# Patient Record
Sex: Male | Born: 1955
Health system: Southern US, Community
[De-identification: ages and names within clinical notes are randomized; demographics above are authoritative.]

## PROBLEM LIST (undated history)

## (undated) DIAGNOSIS — I10 Essential (primary) hypertension: Secondary | ICD-10-CM

## (undated) DIAGNOSIS — I2510 Atherosclerotic heart disease of native coronary artery without angina pectoris: Secondary | ICD-10-CM

## (undated) DIAGNOSIS — E7800 Pure hypercholesterolemia, unspecified: Secondary | ICD-10-CM

## (undated) DIAGNOSIS — E119 Type 2 diabetes mellitus without complications: Secondary | ICD-10-CM

## (undated) HISTORY — DX: Essential (primary) hypertension: I10

## (undated) HISTORY — DX: Pure hypercholesterolemia, unspecified: E78.00

## (undated) HISTORY — DX: Atherosclerotic heart disease of native coronary artery without angina pectoris: I25.10

---

## 1999-03-21 ENCOUNTER — Ambulatory Visit (HOSPITAL_COMMUNITY): Admission: RE | Admit: 1999-03-21 | Discharge: 1999-03-21 | Payer: Self-pay | Admitting: Gastroenterology

## 1999-09-28 ENCOUNTER — Emergency Department (HOSPITAL_COMMUNITY): Admission: EM | Admit: 1999-09-28 | Discharge: 1999-09-28 | Payer: Self-pay | Admitting: Emergency Medicine

## 2002-11-28 ENCOUNTER — Encounter: Payer: Self-pay | Admitting: Emergency Medicine

## 2002-11-28 ENCOUNTER — Emergency Department (HOSPITAL_COMMUNITY): Admission: EM | Admit: 2002-11-28 | Discharge: 2002-11-28 | Payer: Self-pay | Admitting: Emergency Medicine

## 2008-05-06 ENCOUNTER — Encounter: Admission: RE | Admit: 2008-05-06 | Discharge: 2008-05-06 | Payer: Self-pay | Admitting: Internal Medicine

## 2008-12-16 ENCOUNTER — Inpatient Hospital Stay (HOSPITAL_COMMUNITY): Admission: EM | Admit: 2008-12-16 | Discharge: 2008-12-21 | Payer: Self-pay | Admitting: Emergency Medicine

## 2010-11-28 LAB — GLUCOSE, CAPILLARY
Glucose-Capillary: 122 mg/dL — ABNORMAL HIGH (ref 70–99)
Glucose-Capillary: 145 mg/dL — ABNORMAL HIGH (ref 70–99)
Glucose-Capillary: 147 mg/dL — ABNORMAL HIGH (ref 70–99)
Glucose-Capillary: 150 mg/dL — ABNORMAL HIGH (ref 70–99)
Glucose-Capillary: 153 mg/dL — ABNORMAL HIGH (ref 70–99)
Glucose-Capillary: 169 mg/dL — ABNORMAL HIGH (ref 70–99)
Glucose-Capillary: 169 mg/dL — ABNORMAL HIGH (ref 70–99)
Glucose-Capillary: 176 mg/dL — ABNORMAL HIGH (ref 70–99)
Glucose-Capillary: 182 mg/dL — ABNORMAL HIGH (ref 70–99)
Glucose-Capillary: 187 mg/dL — ABNORMAL HIGH (ref 70–99)
Glucose-Capillary: 188 mg/dL — ABNORMAL HIGH (ref 70–99)
Glucose-Capillary: 208 mg/dL — ABNORMAL HIGH (ref 70–99)
Glucose-Capillary: 232 mg/dL — ABNORMAL HIGH (ref 70–99)

## 2010-11-28 LAB — BASIC METABOLIC PANEL
BUN: 7 mg/dL (ref 6–23)
BUN: 7 mg/dL (ref 6–23)
BUN: 8 mg/dL (ref 6–23)
BUN: 8 mg/dL (ref 6–23)
CO2: 26 mEq/L (ref 19–32)
CO2: 27 mEq/L (ref 19–32)
CO2: 29 mEq/L (ref 19–32)
CO2: 29 mEq/L (ref 19–32)
Calcium: 8.3 mg/dL — ABNORMAL LOW (ref 8.4–10.5)
Calcium: 8.3 mg/dL — ABNORMAL LOW (ref 8.4–10.5)
Calcium: 8.3 mg/dL — ABNORMAL LOW (ref 8.4–10.5)
Calcium: 8.5 mg/dL (ref 8.4–10.5)
Chloride: 103 mEq/L (ref 96–112)
Chloride: 104 mEq/L (ref 96–112)
Chloride: 105 mEq/L (ref 96–112)
Chloride: 106 mEq/L (ref 96–112)
Creatinine, Ser: 0.83 mg/dL (ref 0.4–1.5)
Creatinine, Ser: 0.89 mg/dL (ref 0.4–1.5)
Creatinine, Ser: 0.95 mg/dL (ref 0.4–1.5)
Creatinine, Ser: 0.98 mg/dL (ref 0.4–1.5)
GFR calc Af Amer: >60 mL/min (ref >60)
GFR calc Af Amer: >60 mL/min (ref >60)
GFR calc Af Amer: >60 mL/min (ref >60)
GFR calc Af Amer: >60 mL/min (ref >60)
GFR calc non Af Amer: >60 mL/min (ref >60)
GFR calc non Af Amer: >60 mL/min (ref >60)
GFR calc non Af Amer: >60 mL/min (ref >60)
GFR calc non Af Amer: >60 mL/min (ref >60)
Glucose, Bld: 152 mg/dL — ABNORMAL HIGH (ref 70–99)
Glucose, Bld: 154 mg/dL — ABNORMAL HIGH (ref 70–99)
Glucose, Bld: 166 mg/dL — ABNORMAL HIGH (ref 70–99)
Glucose, Bld: 204 mg/dL — ABNORMAL HIGH (ref 70–99)
Potassium: 3.6 mEq/L (ref 3.5–5.1)
Potassium: 3.7 mEq/L (ref 3.5–5.1)
Potassium: 3.8 mEq/L (ref 3.5–5.1)
Potassium: 3.8 mEq/L (ref 3.5–5.1)
Sodium: 136 mEq/L (ref 135–145)
Sodium: 137 mEq/L (ref 135–145)
Sodium: 138 mEq/L (ref 135–145)
Sodium: 139 mEq/L (ref 135–145)

## 2010-11-28 LAB — CBC
HCT: 35 % — ABNORMAL LOW (ref 39.0–52.0)
HCT: 35.1 % — ABNORMAL LOW (ref 39.0–52.0)
HCT: 35.8 % — ABNORMAL LOW (ref 39.0–52.0)
Hemoglobin: 11.9 g/dL — ABNORMAL LOW (ref 13.0–17.0)
Hemoglobin: 11.9 g/dL — ABNORMAL LOW (ref 13.0–17.0)
Hemoglobin: 11.9 g/dL — ABNORMAL LOW (ref 13.0–17.0)
MCHC: 33.4 g/dL (ref 30.0–36.0)
MCHC: 34 g/dL (ref 30.0–36.0)
MCHC: 34 g/dL (ref 30.0–36.0)
MCV: 82.9 fL (ref 78.0–100.0)
MCV: 83.1 fL (ref 78.0–100.0)
MCV: 83.3 fL (ref 78.0–100.0)
Platelets: 243 10*3/uL (ref 150–400)
Platelets: 250 10*3/uL (ref 150–400)
Platelets: 272 10*3/uL (ref 150–400)
RBC: 4.22 MIL/uL (ref 4.22–5.81)
RBC: 4.22 MIL/uL (ref 4.22–5.81)
RBC: 4.29 MIL/uL (ref 4.22–5.81)
RDW: 14.2 % (ref 11.5–15.5)
RDW: 14.3 % (ref 11.5–15.5)
RDW: 14.5 % (ref 11.5–15.5)
WBC: 4.4 10*3/uL (ref 4.0–10.5)
WBC: 4.7 10*3/uL (ref 4.0–10.5)
WBC: 5.1 10*3/uL (ref 4.0–10.5)

## 2010-11-28 LAB — VANCOMYCIN, TROUGH: Vancomycin Tr: 15.3 ug/mL (ref 10.0–20.0)

## 2010-11-29 LAB — GLUCOSE, CAPILLARY
Glucose-Capillary: 140 mg/dL — ABNORMAL HIGH (ref 70–99)
Glucose-Capillary: 176 mg/dL — ABNORMAL HIGH (ref 70–99)
Glucose-Capillary: 186 mg/dL — ABNORMAL HIGH (ref 70–99)
Glucose-Capillary: 191 mg/dL — ABNORMAL HIGH (ref 70–99)
Glucose-Capillary: 208 mg/dL — ABNORMAL HIGH (ref 70–99)
Glucose-Capillary: 220 mg/dL — ABNORMAL HIGH (ref 70–99)
Glucose-Capillary: 237 mg/dL — ABNORMAL HIGH (ref 70–99)
Glucose-Capillary: 241 mg/dL — ABNORMAL HIGH (ref 70–99)
Glucose-Capillary: 261 mg/dL — ABNORMAL HIGH (ref 70–99)

## 2010-11-29 LAB — COMPREHENSIVE METABOLIC PANEL
ALT: 23 U/L (ref 0–53)
AST: 20 U/L (ref 0–37)
Albumin: 3 g/dL — ABNORMAL LOW (ref 3.5–5.2)
Alkaline Phosphatase: 60 U/L (ref 39–117)
BUN: 11 mg/dL (ref 6–23)
CO2: 26 mEq/L (ref 19–32)
Calcium: 8.5 mg/dL (ref 8.4–10.5)
Chloride: 101 mEq/L (ref 96–112)
Creatinine, Ser: 0.92 mg/dL (ref 0.4–1.5)
GFR calc Af Amer: >60 mL/min (ref >60)
GFR calc non Af Amer: >60 mL/min (ref >60)
Glucose, Bld: 214 mg/dL — ABNORMAL HIGH (ref 70–99)
Potassium: 4 mEq/L (ref 3.5–5.1)
Sodium: 134 mEq/L — ABNORMAL LOW (ref 135–145)
Total Bilirubin: 0.5 mg/dL (ref 0.3–1.2)
Total Protein: 7 g/dL (ref 6.0–8.3)

## 2010-11-29 LAB — POCT I-STAT, CHEM 8
BUN: 14 mg/dL (ref 6–23)
Calcium, Ion: 1.12 mmol/L (ref 1.12–1.32)
Chloride: 101 mEq/L (ref 96–112)
Creatinine, Ser: 1 mg/dL (ref 0.4–1.5)
Glucose, Bld: 258 mg/dL — ABNORMAL HIGH (ref 70–99)
HCT: 39 % (ref 39.0–52.0)
Hemoglobin: 13.3 g/dL (ref 13.0–17.0)
Potassium: 3.7 mEq/L (ref 3.5–5.1)
Sodium: 136 mEq/L (ref 135–145)
TCO2: 25 mmol/L (ref 0–100)

## 2010-11-29 LAB — CBC
HCT: 36.7 % — ABNORMAL LOW (ref 39.0–52.0)
HCT: 36.8 % — ABNORMAL LOW (ref 39.0–52.0)
Hemoglobin: 12.3 g/dL — ABNORMAL LOW (ref 13.0–17.0)
Hemoglobin: 12.4 g/dL — ABNORMAL LOW (ref 13.0–17.0)
MCHC: 33.5 g/dL (ref 30.0–36.0)
MCHC: 33.9 g/dL (ref 30.0–36.0)
MCV: 82.1 fL (ref 78.0–100.0)
MCV: 82.9 fL (ref 78.0–100.0)
Platelets: 225 10*3/uL (ref 150–400)
Platelets: 243 10*3/uL (ref 150–400)
RBC: 4.44 MIL/uL (ref 4.22–5.81)
RBC: 4.47 MIL/uL (ref 4.22–5.81)
RDW: 13.9 % (ref 11.5–15.5)
RDW: 14.6 % (ref 11.5–15.5)
WBC: 3.5 10*3/uL — ABNORMAL LOW (ref 4.0–10.5)
WBC: 4 10*3/uL (ref 4.0–10.5)

## 2010-11-29 LAB — HEMOGLOBIN A1C
Hgb A1c MFr Bld: 10.3 % — ABNORMAL HIGH (ref 4.6–6.1)
Mean Plasma Glucose: 249 mg/dL

## 2010-11-29 LAB — DIFFERENTIAL
Basophils Absolute: 0 10*3/uL (ref 0.0–0.1)
Basophils Relative: 0 % (ref 0–1)
Eosinophils Absolute: 0.1 10*3/uL (ref 0.0–0.7)
Eosinophils Relative: 2 % (ref 0–5)
Lymphocytes Relative: 26 % (ref 12–46)
Lymphs Abs: 0.9 10*3/uL (ref 0.7–4.0)
Monocytes Absolute: 0.5 10*3/uL (ref 0.1–1.0)
Monocytes Relative: 14 % — ABNORMAL HIGH (ref 3–12)
Neutro Abs: 2 10*3/uL (ref 1.7–7.7)
Neutrophils Relative %: 58 % (ref 43–77)

## 2010-11-29 LAB — CULTURE, BLOOD (ROUTINE X 2)
Culture: NO GROWTH
Culture: NO GROWTH

## 2010-11-29 LAB — MAGNESIUM: Magnesium: 1.9 mg/dL (ref 1.5–2.5)

## 2010-11-29 LAB — D-DIMER, QUANTITATIVE: D-Dimer, Quant: 0.41 ug/mL-FEU (ref 0.00–0.48)

## 2010-11-29 LAB — APTT: aPTT: 37 seconds (ref 24–37)

## 2010-11-29 LAB — PROTIME-INR
INR: 1 (ref 0.00–1.49)
Prothrombin Time: 13.1 seconds (ref 11.6–15.2)

## 2011-01-02 NOTE — H&P (Signed)
NAME:  Levi Roberts, VOS NO.:  [id number]   MEDICAL RECORD NO.:  [id number]          PATIENT TYPE:  EMS   LOCATION:  ED                           FACILITY:  [facility]   PHYSICIAN:  Vania Rea, M.D. DATE OF BIRTH:  November 23, 1955   DATE OF ADMISSION:  12/16/2008  DATE OF DISCHARGE:                              HISTORY & PHYSICAL   PRIMARY CARE PHYSICIAN:  Merlene Laughter. Renae Gloss, M.D.   CHIEF COMPLAINT:  Swelling and redness of the right lower extremity for  the past 3 days.   HISTORY OF PRESENT ILLNESS:  This is a 55 year old obese African  American gentleman with a history of diabetes type 2 for many years, now  maintained on Lantus as well as multiple oral antidiabetic medications.  Does not check his blood sugars regularly, usually about twice per week  and they usually are in the 200 range.  Recently they have been in the  400 range, and on Sunday night he started noticing increasing redness of  the right lower extremity associated with swelling.  He has had no  pains, according to his family, he did at one time have a fever, but he  does not recall.  Eventually, this evening, he came to the emergency  room, was evaluated and the hospital service called to assist with  management.  There is no history of trauma to the area and he has no  numbness of his foot.  Other than the pain in the lower extremities,  only other problems is increased frequency of micturition and blurring  of vision which she associates with his uncontrolled diabetes.   PAST MEDICAL HISTORY:  Diabetes type 2.   MEDICATIONS:  1. Lantus 16 units at bedtime.  2. Actos one tablet daily, dose known.  3. Glucophage two tablets daily, dose unknown.  4. Amaryl one tablet daily, dose unknown.   ALLERGIES:  No known  drug allergies.   SOCIAL HISTORY:  Denies tobacco, alcohol or illicit drug use.  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   FAMILY HISTORY:  Significant for mother with diabetes and father who  died of cancer of the kidneys.  Two sisters who are healthy.   REVIEW OF SYSTEMS:  Other than noted above, a 10-point review of systems  is unremarkable.   PHYSICAL EXAMINATION:  GENERAL:  Obese, middle-aged African American  gentleman lying in the stretcher, not acutely distressed.  VITAL SIGNS:  He gives his height is 60 inches and his weight is 323  pounds.  HEENT:  His pupils are round and equal.  Mucous membranes pink and  anicteric.  No cervical lymphadenopathy or thyromegaly.  Has a thick  neck.  No jugular venous distention.  CHEST:  Clear to auscultation bilaterally.  CARDIOVASCULAR:  Regular rhythm without murmur.  ABDOMEN:  Obese, soft and nontender.  There are no masses.  SKIN:  Without rash or blemish.  EXTREMITIES:  His right lower extremity is swollen and erythematous down  to the ankle and below the knee.  It is not tender to touch.  Dorsalis  pedis pulses are 2+ bilaterally.  He does have trace edema of the left  lower extremity.  CENTRAL NERVOUS SYSTEM: Cranial nerves II-XII are grossly intact and he  has no focal neurologic deficit.  In particular, there does not appear  to be inferior sensation in the lower extremity.   LABORATORY DATA:  White count is normal at 3.5 in a black man,  hemoglobin is 12.4, MCV 8.2, platelets 243, normal differential.  Serum  chemistry is significant only for glucose of 28, otherwise, it is  completely normal.  Creatinine is 1.0.  A two-view chest x-ray shows no  acute findings.   ASSESSMENT:  1. Right lower extremity cellulitis.  2. Diabetes type 2 uncontrolled.   PLAN:  Will admit this gentleman for IV antibiotics therapy and tighter  control of his diabetes.  Other plans as per orders.      Vania Rea, M.D.  Electronically Signed     LC/MEDQ  D:  12/16/2008  T:  12/16/2008  Job:  [id number]

## 2011-01-02 NOTE — Discharge Summary (Signed)
NAME:  Levi Roberts, Levi Roberts NO.:  [id number]   MEDICAL RECORD NO.:  [id number]          PATIENT TYPE:  INP   LOCATION:  1502                         FACILITY:  [facility]   PHYSICIAN:  Theodosia Paling, MD    DATE OF BIRTH:  May 05, 1956   DATE OF ADMISSION:  12/15/2008  DATE OF DISCHARGE:  12/21/2008                               DISCHARGE SUMMARY   PRIMARY CARE PHYSICIAN:  Cala Bradford R. Renae Gloss, M.D.   ADMITTING HISTORY:  Please refer to the excellent admission note  dictated by Vania Rea, M.D.   DISCHARGE DIAGNOSES:  1. Right lower extremity cellulitis.  2. Newly diagnosed hypertension.   SECONDARY DIAGNOSIS:  History of diabetes mellitus type 2.   DISCHARGE MEDICATIONS:  New medications added:  1. Augmentin 875 mg p.o. q.12 h for 1 week.  2. Lantus insulin 20 units subcu q.h.s.  3. HCTZ 25 mg p.o. daily.  4. Lisinopril 40 mg p.o. daily.   Home medications to be continued:  1. Metformin 100 mg p.o. q.12 h.  2. Actos 45 mg p.o. daily.  3. Fish oil 1000 mg p.o. daily.   HOSPITAL COURSE:  The following issues were addressed during the  hospitalization:  1. Cellulitis.  The patient received IV vancomycin and Unasyn.  He has      always had significant lymphedema on the right lower extremity      since he had an ankle injury several months ago.  To begin with,      his right leg was more swollen; however, his redness, erythema and      warmth significantly improved with IV antibiotics.  He will be      completing 1 more week of p.o. Augmentin as an outpatient.  Last      WBC count was obtained on Dec 20, 2008, and was 4.4.  He stayed      hemodynamically stable and tolerated the antibiotics very well.  He      will be following up with Dr. Andi Devon for his cellulitis      and hypertension in 1 week's time.  2. Newly diagnosed hypertension.  The patient was noted to have      elevated blood pressure.  He does not have a history of high blood  pressure.  His blood pressure is fairly controlled on lisinopril      and HCTZ.  He will be following up with Dr. Andi Devon for      further evaluation and management within 1 week's time for his high      blood pressure.  3. Diabetes mellitus.  The patient is currently on 20 units of Lantus.      He has requested to see an endocrinologist for his diabetes      management, so I have referred him to Dr. Evlyn Kanner.  He will be      following up with Dr. Evlyn Kanner in 1 week's time in his clinic for      further management of his diabetes mellitus.  His glucose seemed to      be well controlled with good control of his fasting  blood glucose.      He did not develop any acute complications from his diabetes during      the hospitalization.   DISPOSITION:  1. The patient will follow up with Dr. Andi Devon in 1 week's      time for management of his hypertension and followup of cellulitis.  2. The patient will follow up with Dr. Evlyn Kanner in his clinic in 1 week's      time for evaluation and management of his diabetes.   PROCEDURES PERFORMED:  None.   IMAGING PERFORMED:  Chest x-ray performed on December 16, 2008, showing no  acute cardiopulmonary process.   CONSULTATIONS PERFORMED:  None.   Total time spent in discharge of this patient around 45 minutes.      Theodosia Paling, MD  Electronically Signed     NP/MEDQ  D:  12/21/2008  T:  12/21/2008  Job:  [id number]   cc:   Tera Mater. Evlyn Kanner, M.D.  Fax: [phone]   Merlene Laughter. Renae Gloss, M.D.  Fax: [phone]

## 2011-10-19 ENCOUNTER — Ambulatory Visit (HOSPITAL_COMMUNITY)
Admission: RE | Admit: 2011-10-19 | Discharge: 2011-10-19 | Disposition: A | Discharge status: Home / Self Care | Payer: 59 | Source: Ambulatory Visit | Attending: Internal Medicine | Admitting: Internal Medicine

## 2011-10-19 DIAGNOSIS — M79609 Pain in unspecified limb: Secondary | ICD-10-CM

## 2011-10-19 DIAGNOSIS — M79604 Pain in right leg: Secondary | ICD-10-CM

## 2011-10-19 DIAGNOSIS — R600 Localized edema: Secondary | ICD-10-CM

## 2011-10-19 DIAGNOSIS — M7989 Other specified soft tissue disorders: Secondary | ICD-10-CM | POA: Insufficient documentation

## 2011-10-19 NOTE — Progress Notes (Signed)
Right lower extremity venous duplex completed.  Preliminary report is negative for DVT, SVT, or a Baker's cyst in the right leg.  Negative for DVT in the left common femoral vein. 

## 2017-07-05 ENCOUNTER — Encounter (HOSPITAL_COMMUNITY): Payer: Self-pay | Admitting: Family Medicine

## 2017-07-05 ENCOUNTER — Ambulatory Visit (HOSPITAL_COMMUNITY)
Admission: EM | Admit: 2017-07-05 | Discharge: 2017-07-05 | Disposition: A | Discharge status: Home / Self Care | Payer: BLUE CROSS/BLUE SHIELD | Attending: Internal Medicine | Admitting: Internal Medicine

## 2017-07-05 DIAGNOSIS — Z760 Encounter for issue of repeat prescription: Secondary | ICD-10-CM | POA: Diagnosis not present

## 2017-07-05 DIAGNOSIS — E119 Type 2 diabetes mellitus without complications: Secondary | ICD-10-CM

## 2017-07-05 HISTORY — DX: Type 2 diabetes mellitus without complications: E11.9

## 2017-07-05 MED ORDER — INSULIN GLARGINE 100 UNIT/ML ~~LOC~~ SOLN: 40.0000 [IU] | Freq: Every day | SUBCUTANEOUS | 2 refills | Status: DC

## 2017-07-05 MED ORDER — METFORMIN HCL ER 750 MG PO TB24: 750.0000 mg | ORAL_TABLET | Freq: Three times a day (TID) | ORAL | 1 refills | Status: DC

## 2017-07-05 NOTE — ED Triage Notes (Signed)
Pt here for refill on Lantus and Metformin.

## 2017-07-05 NOTE — Discharge Instructions (Signed)
Return if any problems.

## 2017-07-06 ENCOUNTER — Telehealth (HOSPITAL_COMMUNITY): Payer: Self-pay | Admitting: Family Medicine

## 2017-07-06 MED ORDER — INSULIN GLARGINE 100 UNITS/ML SOLOSTAR PEN: 40.0000 [IU] | PEN_INJECTOR | Freq: Every day | SUBCUTANEOUS | 11 refills | Status: DC

## 2017-07-07 NOTE — ED Provider Notes (Signed)
[facility]    CSN: [id number] Arrival date & time: 07/05/17  1925     History   Chief Complaint Chief Complaint  Patient presents with  . Medication Refill    HPI Levi Roberts is a 61 y.o. male.   The history is provided by the patient. No language interpreter was used.  Medication Refill  Medications/supplies requested:  Lantus and metformin Reason for request:  Medications ran out Pt reports his MD is closing office.  Dr. Chestine Sporelark.    Past Medical History:  Diagnosis Date  . Diabetes mellitus without complication (HCC)     There are no active problems to display for this patient.   History reviewed. No pertinent surgical history.     Home Medications    Prior to Admission medications   Medication Sig Start Date End Date Taking? Authorizing Provider  Exenatide ER (BYDUREON) 2 MG PEN Inject 2 mg once into the skin.   Yes [provider]  insulin glargine (LANTUS) 100 unit/mL SOPN Inject 0.4-0.6 mLs (40-60 Units total) at bedtime into the skin. 07/06/17   Eustace MooreMurray, Laura W, MD  metFORMIN (GLUCOPHAGE-XR) 750 MG 24 hr tablet Take 1 tablet (750 mg total) 3 (three) times daily by mouth. 07/05/17   Elson AreasSofia, Shiniqua Groseclose K, PA-C    Family History History reviewed. No pertinent family history.  Social History Social History   Tobacco Use  . Smoking status: Never Smoker  . Smokeless tobacco: Never Used  Substance Use Topics  . Alcohol use: Not on file  . Drug use: Not on file     Allergies   Patient has no known allergies.   Review of Systems Review of Systems  All other systems reviewed and are negative.    Physical Exam Triage Vital Signs ED Triage Vitals [07/05/17 1949]  Enc Vitals Group     BP (!) 151/83     Pulse Rate 86     Resp 18     Temp 98.3 F (36.8 C)     Temp src      SpO2 97 %     Weight      Height      Head Circumference      Peak Flow      Pain Score      Pain Loc      Pain Edu?      Excl. in GC?     No data found.  Updated Vital Signs BP (!) 151/83   Pulse 86   Temp 98.3 F (36.8 C)   Resp 18   SpO2 97%   Visual Acuity Right Eye Distance:   Left Eye Distance:   Bilateral Distance:    Right Eye Near:   Left Eye Near:    Bilateral Near:     Physical Exam  Constitutional: He appears well-developed and well-nourished.  HENT:  Head: Normocephalic and atraumatic.  Cardiovascular: Normal rate and regular rhythm.  No murmur heard. Pulmonary/Chest: Effort normal and breath sounds normal. No respiratory distress.  Abdominal: Soft. There is no tenderness.  Musculoskeletal: He exhibits no edema.  Neurological: He is alert.  Skin: Skin is warm and dry.  Psychiatric: He has a normal mood and affect.  Nursing note and vitals reviewed.    UC Treatments / Results  Labs (all labs ordered are listed, but only abnormal results are displayed) Labs Reviewed - No data to display  EKG  EKG Interpretation None       Radiology No  results found.  Procedures Procedures (including critical care time)  Medications Ordered in UC Medications - No data to display   Initial Impression / Assessment and Plan / UC Course  I have reviewed the triage vital signs and the nursing notes.  Pertinent labs & imaging results that were available during my care of the patient were reviewed by me and considered in my medical decision making (see chart for details).     Pt given referrals for primary care MD.  Final Clinical Impressions(s) / UC Diagnoses   Final diagnoses:  Encounter for medication refill    ED Discharge Orders        Ordered    metFORMIN (GLUCOPHAGE-XR) 750 MG 24 hr tablet  3 times daily     07/05/17 2005    insulin glargine (LANTUS) 100 UNIT/ML injection  Daily,   Status:  Discontinued     07/05/17 2005       Controlled Substance Prescriptions [place] Controlled Substance Registry consulted? Not Applicable  An After Visit Summary was printed and given to the  patient.    Elson AreasSofia, Lynze Reddy K, [place]PA-C 07/07/17 1218

## 2017-10-14 ENCOUNTER — Encounter: Payer: Self-pay | Admitting: Neurology

## 2017-10-15 ENCOUNTER — Ambulatory Visit (INDEPENDENT_AMBULATORY_CARE_PROVIDER_SITE_OTHER): Payer: BLUE CROSS/BLUE SHIELD | Admitting: Neurology

## 2017-10-15 ENCOUNTER — Encounter: Payer: Self-pay | Admitting: Neurology

## 2017-10-15 VITALS — BP 130/80 | HR 83 | Ht 75.0 in | Wt 319.0 lb

## 2017-10-15 DIAGNOSIS — G4726 Circadian rhythm sleep disorder, shift work type: Secondary | ICD-10-CM | POA: Insufficient documentation

## 2017-10-15 DIAGNOSIS — Z6839 Body mass index (BMI) 39.0-39.9, adult: Secondary | ICD-10-CM

## 2017-10-15 DIAGNOSIS — Z6838 Body mass index (BMI) 38.0-38.9, adult: Secondary | ICD-10-CM

## 2017-10-15 DIAGNOSIS — R0683 Snoring: Secondary | ICD-10-CM | POA: Diagnosis not present

## 2017-10-15 DIAGNOSIS — Z72821 Inadequate sleep hygiene: Secondary | ICD-10-CM | POA: Insufficient documentation

## 2017-10-15 HISTORY — DX: Circadian rhythm sleep disorder, shift work type: G47.26

## 2017-10-15 NOTE — Addendum Note (Signed)
Addended by: Melvyn NovasHMEIER, Jayziah Bankhead on: 10/15/2017 09:40 AM   Modules accepted: Orders

## 2017-10-15 NOTE — Progress Notes (Signed)
SLEEP MEDICINE CLINIC   Provider:  Melvyn Novas, M D  Primary Care Physician:  Dorothyann Peng, MD   Referring Provider:  Dorothyann Peng, MD    Chief Complaint  Patient presents with  . New Patient (Initial Visit)    pt alone, rm 11, pt has never had a sleep study. pt has been told he snores in sleep. but other then that he sleeps well and has no issues the next day    HPI:  Levi Roberts is a 62 y.o. male , seen here as in a referral from Dr. Allyne Gee.  Levi Roberts is a 62 year old left-handed ambidextrous gentleman referred by Dr. Allyne Gee for a evaluation of possible sleep apnea. His wife reported he snores, he feels he sleeps well.  He has carried the diagnosis of type 2 diabetes and hypertension, and by the end of 2018 had 4 short run out of medication.  During that time he felt and sensed his hyperglycemia, but he did not report headaches, frequent nocturia, dizziness or nausea.  He has been managed with diet and oral medications and his home glucose readings have been between 150 and 230.  He has noted that his hypertension is exacerbated when under stress.  He does not have chest pain, does not report headaches confusion, clamminess or irregular heartbeat/palpitations.  Risk factors for the presence of sleep apnea are is elevated body mass index, African-American race, male gender and being over 46.   Sleep habits are as follows: He watches TV before going to bed, and continues to watch TV in his bedroom- being neither cool nor dark, nor quiet- his wife is already asleep at this time, using CPAP.  He turns the TV off when he feel ready to sleep, around midnight. He sleeps on his sides, and stays asleep except for one nocturia. Sleeps with 2 pillows. He rises at 6 AM, alarm set for 5.30 - he works in the [employer].  He was a night shift worker for 42 years , now early shift.  He is used to sleep alone in daytime. He is used to 5.5 hours of sleep. On weekends he  sleeps in , getting 7.5 hours of sleep.  (He presented me with his phone sleep sleep function recordings)    Sleep medical history and family sleep history: No OSA in his blood relations, his son in [place] was negative.    Social history:  Long time 42 years of night shift- [employer] at " [employer]".  Lifelong non tobacco user - ETOH- never , caffeine - drinks diet soda( no sugar) , no coffee, iced tea, or Energy drinks.  Married , 4 adult children-  Wife works in day shifts.  No pets.    Review of Systems: Out of a complete 14 system review, the patient complains of only the following symptoms, and all other reviewed systems are negative.  Worked 2 jobs, [employer] at 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   Epworth score 3 24  , Fatigue severity score 16  , depression score 3/ 15    Social History   Socioeconomic History  . Marital status: Married    Spouse name: Not on file  . Number of children: Not on file  . Years of education: Not on file  . Highest education level: Not on file  Social Needs  . Financial resource strain: Not on file  . Food insecurity - worry: Not on file  . Food  insecurity - inability: Not on file  . Transportation needs - medical: Not on file  . Transportation needs - non-medical: Not on file  Occupational History  . Not on file  Tobacco Use  . Smoking status: Never Smoker  . Smokeless tobacco: Never Used  Substance and Sexual Activity  . Alcohol use: Not on file  . Drug use: Not on file  . Sexual activity: Not on file  Other Topics Concern  . Not on file  Social History Narrative  . Not on file    No family history on file.  Past Medical History:  Diagnosis Date  . Diabetes mellitus without complication (HCC)   . Hypertension     No past surgical history on file.  Current Outpatient Medications  Medication Sig Dispense Refill  . Cholecalciferol (VITAMIN D3) 50000 units CAPS TAKE ONE CAPSULE ON TUESDAYS AND FRIDAYS  2  .  finasteride (PROSCAR) 5 MG tablet Take 5 mg by mouth daily.    [name] Kitchen OZEMPIC 0.25 or 0.5 MG/DOSE SOPN 0.5 mg.  0  . telmisartan (MICARDIS) 20 MG tablet Take 20 mg by mouth daily.  1  . TRESIBA FLEXTOUCH 200 UNIT/ML SOPN INJECT 60 UNITS DAILY AS DIRECTED -MAX DOSE 100 UNITS-  1   No current facility-administered medications for this visit.     Allergies as of 10/15/2017  . (No Known Allergies)     Last Labs.  HbAic 11.9-  The patient's last hematocrit and hemoglobin were normal levels 39/13, white blood cell count 4.4 platelet count 272 K, MCV 80.  ALT and AST are normal level, BUN/creatinine ratio was 11, creatinine 1 1.06.  Glucose 220, potassium 4.5, sodium 136.   CMP Latest Ref Rng & Units 12/21/2008 12/20/2008 12/19/2008  Glucose 70 - 99 mg/dL 409(W) 119(J) 478(G)  BUN 6 - 23 mg/dL 7 7 8   Creatinine 0.4 - 1.5 mg/dL 9.56 2.13 0.86  Sodium 135 - 145 mEq/L 138 137 139  Potassium 3.5 - 5.1 mEq/L 3.7 3.6 3.8  Chloride 96 - 112 mEq/L 104 106 103  CO2 19 - 32 mEq/L 29 26 29   Calcium 8.4 - 10.5 mg/dL 8.5 5.7(Q) 8.3(L)  Total Protein 6.0 - 8.3 g/dL - - -  Total Bilirubin 0.3 - 1.2 mg/dL - - -  Alkaline Phos 39 - 117 U/L - - -  AST 0 - 37 U/L - - -  ALT 0 - 53 U/L - - -      Vitals: BP 130/80   Pulse 83   Ht 6\' 3"  (1.905 m)   Wt (!) 319 lb (144.7 kg)   BMI 39.87 kg/m  Last Weight:  Wt Readings from Last 1 Encounters:  10/15/17 (!) 319 lb (144.7 kg)   ION:GEXB mass index is 39.87 kg/m.     Last Height:   Ht Readings from Last 1 Encounters:  10/15/17 6\' 3"  (1.905 m)    Physical exam:  General: The patient is awake, alert and appears not in acute distress. The patient is well groomed. Head: Normocephalic, atraumatic. Neck is supple. Mallampati 5  neck circumference:19. Nasal airflow patent ,  Retrognathia is noted .  Cardiovascular:  Regular rate and rhythm , without  murmurs or carotid bruit, and without distended neck veins. Respiratory: Lungs are clear to auscultation. Skin:   Without evidence of edema, or rash Trunk: BMI is elevated to almost 40 . The patient's posture is erect.  Neurologic exam : The patient is awake and alert, oriented to place  and time.     Attention span & concentration ability appears normal.  Speech is fluent,  without dysarthria, dysphonia or aphasia.  Mood and affect are appropriate.  Cranial nerves: Pupils are equal and briskly reactive to light. Funduscopic exam without evidence of pallor or edema. Extraocular movements  in vertical and horizontal planes intact and without nystagmus. Visual fields by finger perimetry are intact. Hearing to finger rub intact.  Facial sensation intact to fine touch. Facial motor strength is symmetric and tongue and uvula move midline. Shoulder shrug was symmetrical.  Motor exam:  Normal tone, muscle bulk and symmetric strength in all extremities. Sensory:  Fine touch, pinprick and vibration were tested in all extremities. Proprioception tested in the upper extremities was normal. He has numbness in the right hand thumb and index and middle finger, positive tinnel sign.  Coordination: Rapid alternating movements in the fingers/hands was normal. Finger-to-nose maneuver  normal without evidence of ataxia, dysmetria or tremor. Gait and station: Patient walks without assistive device and is able unassisted to climb up to the exam table. Strength within normal limits. No falls reported.  Stance is stable and normal.  Deep tendon reflexes: in the  upper and lower extremities are symmetric and intact. Babinski maneuver response is  downgoing.    Assessment:  After physical and neurologic examination, review of laboratory studies,  Personal review of imaging studies, reports of other /same  Imaging studies, results of polysomnography and / or neurophysiology testing and pre-existing records as far as provided in visit., my assessment is   1) Levi Roberts has several risk factors for the presence of obstructive  sleep apnea and his wife has told him that he snores.  In addition he does have a very restricted limited amount of sleep each day.  There is a great difference between weekdays and weekends when he can sleep in.  Weekdays he usually makes to do was less than 5 hours of sleep.  He has only 1 nocturia and reports neither headaches no dizziness clamminess or chest pain when waking up in the morning.  As long as he stays busy he feels that he is well rested and can make it through the day.  Based on his risk factors I would like him to attend a sleep study.  I would like a split-night polysomnography with an AHI of 20.  If his insurance will not agree on this we could use a home sleep test device.  I would be very surprised if Levi Roberts does not have at least mild apnea, given his red prognathia, stronger midface, and a neck circumference of almost 19 inches.  He has been stable with his weight but certainly would benefit if she could lose.    Since he is now reestablished with a new primary care physician he has been back on his medication and has received health and wellness education.  His last labs  were looking well except for the high HbA1c.     The patient was advised of the nature of the diagnosed disorder , the treatment options and the  risks for general health and wellness arising from not treating the condition.   I spent more than 45 minutes of face to face time with the patient.  Greater than 50% of time was spent in counseling and coordination of care. We have discussed the diagnosis and differential and I answered the patient's questions.    Plan:  Treatment plan and additional workup :   SPLIT  night AHI at 20,  Snoring, retrognathia, obesity, large neck and high grade Mallampati.   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   Sleep hygiene education - additional discussion and hands out given - 15 minutes.  RV after test   Melvyn Novas, MD 10/15/2017, 4:09 AM    Certified in Neurology by ABPN Certified in Sleep Medicine by [facility] [address] [place], [place] [address]

## 2017-10-15 NOTE — Patient Instructions (Signed)

## 2017-10-16 ENCOUNTER — Other Ambulatory Visit: Payer: Self-pay | Admitting: Neurology

## 2017-10-16 ENCOUNTER — Telehealth: Payer: Self-pay

## 2017-10-16 DIAGNOSIS — R0683 Snoring: Secondary | ICD-10-CM

## 2017-10-16 DIAGNOSIS — Z6835 Body mass index (BMI) 35.0-35.9, adult: Secondary | ICD-10-CM

## 2017-10-16 DIAGNOSIS — G4726 Circadian rhythm sleep disorder, shift work type: Secondary | ICD-10-CM

## 2017-10-16 NOTE — Telephone Encounter (Signed)
Order placed

## 2017-10-16 NOTE — Telephone Encounter (Signed)
[employer] denied in in lab sleep study, need HST order.  Retrognathia and Mallampati does not get it approved

## 2017-10-29 ENCOUNTER — Telehealth: Payer: Self-pay | Admitting: Neurology

## 2017-10-29 NOTE — Telephone Encounter (Signed)
Called pt to confirm his 3/13 appt with the sleep lab, pt cancelled because he said he is going out of town. He stated he will call back to reschedule when back in town.

## 2018-05-20 ENCOUNTER — Other Ambulatory Visit: Payer: Self-pay | Admitting: Internal Medicine

## 2018-05-23 ENCOUNTER — Other Ambulatory Visit: Payer: Self-pay | Admitting: Internal Medicine

## 2018-06-06 ENCOUNTER — Other Ambulatory Visit: Payer: Self-pay | Admitting: Internal Medicine

## 2018-06-09 ENCOUNTER — Other Ambulatory Visit: Payer: Self-pay

## 2018-06-09 MED ORDER — SEMAGLUTIDE (1 MG/DOSE) 2 MG/1.5ML ~~LOC~~ SOPN: 1.0000 mg | PEN_INJECTOR | SUBCUTANEOUS | 0 refills | Status: DC

## 2018-06-27 ENCOUNTER — Encounter: Payer: BLUE CROSS/BLUE SHIELD | Admitting: Internal Medicine

## 2018-07-07 ENCOUNTER — Other Ambulatory Visit: Payer: Self-pay | Admitting: Internal Medicine

## 2018-07-10 ENCOUNTER — Other Ambulatory Visit: Payer: Self-pay | Admitting: Internal Medicine

## 2018-08-08 ENCOUNTER — Other Ambulatory Visit: Payer: Self-pay | Admitting: Internal Medicine

## 2018-09-12 ENCOUNTER — Other Ambulatory Visit: Payer: Self-pay | Admitting: Internal Medicine

## 2018-10-31 ENCOUNTER — Other Ambulatory Visit: Payer: Self-pay | Admitting: Internal Medicine

## 2018-11-05 ENCOUNTER — Other Ambulatory Visit: Payer: Self-pay | Admitting: Internal Medicine

## 2018-11-11 ENCOUNTER — Other Ambulatory Visit: Payer: Self-pay | Admitting: Internal Medicine

## 2018-12-09 ENCOUNTER — Other Ambulatory Visit: Payer: Self-pay | Admitting: Internal Medicine

## 2018-12-15 ENCOUNTER — Other Ambulatory Visit: Payer: Self-pay

## 2018-12-15 MED ORDER — TRESIBA FLEXTOUCH 200 UNIT/ML ~~LOC~~ SOPN: 60.0000 [IU] | PEN_INJECTOR | Freq: Every day | SUBCUTANEOUS | 0 refills | Status: DC

## 2018-12-16 ENCOUNTER — Other Ambulatory Visit: Payer: Self-pay

## 2018-12-16 MED ORDER — TRESIBA FLEXTOUCH 200 UNIT/ML ~~LOC~~ SOPN: 60.0000 [IU] | PEN_INJECTOR | Freq: Every day | SUBCUTANEOUS | 0 refills | Status: DC

## 2018-12-17 ENCOUNTER — Other Ambulatory Visit: Payer: Self-pay

## 2018-12-17 ENCOUNTER — Encounter: Payer: Self-pay | Admitting: Internal Medicine

## 2018-12-17 ENCOUNTER — Ambulatory Visit: Payer: BLUE CROSS/BLUE SHIELD | Admitting: Internal Medicine

## 2018-12-17 VITALS — BP 124/76 | HR 92 | Temp 98.0°F | Ht 75.0 in | Wt 306.2 lb

## 2018-12-17 DIAGNOSIS — Z23 Encounter for immunization: Secondary | ICD-10-CM | POA: Diagnosis not present

## 2018-12-17 DIAGNOSIS — I10 Essential (primary) hypertension: Secondary | ICD-10-CM

## 2018-12-17 DIAGNOSIS — E1159 Type 2 diabetes mellitus with other circulatory complications: Secondary | ICD-10-CM

## 2018-12-17 DIAGNOSIS — I152 Hypertension secondary to endocrine disorders: Secondary | ICD-10-CM

## 2018-12-17 DIAGNOSIS — E669 Obesity, unspecified: Secondary | ICD-10-CM

## 2018-12-17 DIAGNOSIS — E1169 Type 2 diabetes mellitus with other specified complication: Secondary | ICD-10-CM

## 2018-12-17 DIAGNOSIS — Z1211 Encounter for screening for malignant neoplasm of colon: Secondary | ICD-10-CM

## 2018-12-17 DIAGNOSIS — Z6838 Body mass index (BMI) 38.0-38.9, adult: Secondary | ICD-10-CM

## 2018-12-17 DIAGNOSIS — E7800 Pure hypercholesterolemia, unspecified: Secondary | ICD-10-CM | POA: Diagnosis not present

## 2018-12-17 MED ORDER — ATORVASTATIN CALCIUM 20 MG PO TABS: 20.0000 mg | ORAL_TABLET | Freq: Every day | ORAL | 1 refills | Status: DC

## 2018-12-17 MED ORDER — FINASTERIDE 5 MG PO TABS: 5.0000 mg | ORAL_TABLET | Freq: Every day | ORAL | 1 refills | Status: DC

## 2018-12-17 MED ORDER — TELMISARTAN 20 MG PO TABS: 20.0000 mg | ORAL_TABLET | Freq: Every day | ORAL | 1 refills | Status: DC

## 2018-12-17 MED ORDER — SEMAGLUTIDE (1 MG/DOSE) 2 MG/1.5ML ~~LOC~~ SOPN: 1.0000 mg | PEN_INJECTOR | SUBCUTANEOUS | 1 refills | Status: DC

## 2018-12-17 MED ORDER — TRESIBA FLEXTOUCH 200 UNIT/ML ~~LOC~~ SOPN: 60.0000 [IU] | PEN_INJECTOR | Freq: Every day | SUBCUTANEOUS | 1 refills | Status: DC

## 2018-12-17 MED ORDER — TETANUS-DIPHTH-ACELL PERTUSSIS 5-2.5-18.5 LF-MCG/0.5 IM SUSP
0.5000 mL | Freq: Once | INTRAMUSCULAR | Status: AC
  Administered 2018-12-17: 0.5 mL via INTRAMUSCULAR

## 2018-12-17 NOTE — Patient Instructions (Signed)
Diabetes Mellitus and Exercise Exercising regularly is important for your overall health, especially when you have diabetes (diabetes mellitus). Exercising is not only about losing weight. It has many other health benefits, such as increasing muscle strength and bone density and reducing body fat and stress. This leads to improved fitness, flexibility, and endurance, all of which result in better overall health. Exercise has additional benefits for people with diabetes, including:  Reducing appetite.  Helping to lower and control blood glucose.  Lowering blood pressure.  Helping to control amounts of fatty substances (lipids) in the blood, such as cholesterol and triglycerides.  Helping the body to respond better to insulin (improving insulin sensitivity).  Reducing how much insulin the body needs.  Decreasing the risk for heart disease by: ? Lowering cholesterol and triglyceride levels. ? Increasing the levels of good cholesterol. ? Lowering blood glucose levels. What is my activity plan? Your health care provider or certified diabetes educator can help you make a plan for the type and frequency of exercise (activity plan) that works for you. Make sure that you:  Do at least 150 minutes of moderate-intensity or vigorous-intensity exercise each week. This could be brisk walking, biking, or water aerobics. ? Do stretching and strength exercises, such as yoga or weightlifting, at least 2 times a week. ? Spread out your activity over at least 3 days of the week.  Get some form of physical activity every day. ? Do not go more than 2 days in a row without some kind of physical activity. ? Avoid being inactive for more than 30 minutes at a time. Take frequent breaks to walk or stretch.  Choose a type of exercise or activity that you enjoy, and set realistic goals.  Start slowly, and gradually increase the intensity of your exercise over time. What do I need to know about managing my  diabetes?   Check your blood glucose before and after exercising. ? If your blood glucose is 240 mg/dL (13.3 mmol/L) or higher before you exercise, check your urine for ketones. If you have ketones in your urine, do not exercise until your blood glucose returns to normal. ? If your blood glucose is 100 mg/dL (5.6 mmol/L) or lower, eat a snack containing 15-20 grams of carbohydrate. Check your blood glucose 15 minutes after the snack to make sure that your level is above 100 mg/dL (5.6 mmol/L) before you start your exercise.  Know the symptoms of low blood glucose (hypoglycemia) and how to treat it. Your risk for hypoglycemia increases during and after exercise. Common symptoms of hypoglycemia can include: ? Hunger. ? Anxiety. ? Sweating and feeling clammy. ? Confusion. ? Dizziness or feeling light-headed. ? Increased heart rate or palpitations. ? Blurry vision. ? Tingling or numbness around the mouth, lips, or tongue. ? Tremors or shakes. ? Irritability.  Keep a rapid-acting carbohydrate snack available before, during, and after exercise to help prevent or treat hypoglycemia.  Avoid injecting insulin into areas of the body that are going to be exercised. For example, avoid injecting insulin into: ? The arms, when playing tennis. ? The legs, when jogging.  Keep records of your exercise habits. Doing this can help you and your health care provider adjust your diabetes management plan as needed. Write down: ? Food that you eat before and after you exercise. ? Blood glucose levels before and after you exercise. ? The type and amount of exercise you have done. ? When your insulin is expected to peak, if you use   insulin. Avoid exercising at times when your insulin is peaking.  When you start a new exercise or activity, work with your health care provider to make sure the activity is safe for you, and to adjust your insulin, medicines, or food intake as needed.  Drink plenty of water while  you exercise to prevent dehydration or heat stroke. Drink enough fluid to keep your urine clear or pale yellow. Summary  Exercising regularly is important for your overall health, especially when you have diabetes (diabetes mellitus).  Exercising has many health benefits, such as increasing muscle strength and bone density and reducing body fat and stress.  Your health care provider or certified diabetes educator can help you make a plan for the type and frequency of exercise (activity plan) that works for you.  When you start a new exercise or activity, work with your health care provider to make sure the activity is safe for you, and to adjust your insulin, medicines, or food intake as needed. This information is not intended to replace advice given to you by your health care provider. Make sure you discuss any questions you have with your health care provider. Document Released: 10/27/2003 Document Revised: 02/14/2017 Document Reviewed: 01/16/2016 Elsevier Interactive Patient Education  2019 Elsevier Inc.  

## 2018-12-18 DIAGNOSIS — E669 Obesity, unspecified: Secondary | ICD-10-CM | POA: Insufficient documentation

## 2018-12-18 DIAGNOSIS — E1169 Type 2 diabetes mellitus with other specified complication: Secondary | ICD-10-CM | POA: Insufficient documentation

## 2018-12-18 DIAGNOSIS — E119 Type 2 diabetes mellitus without complications: Secondary | ICD-10-CM | POA: Insufficient documentation

## 2018-12-18 DIAGNOSIS — I152 Hypertension secondary to endocrine disorders: Secondary | ICD-10-CM | POA: Insufficient documentation

## 2018-12-18 DIAGNOSIS — E7800 Pure hypercholesterolemia, unspecified: Secondary | ICD-10-CM | POA: Insufficient documentation

## 2018-12-18 DIAGNOSIS — E1159 Type 2 diabetes mellitus with other circulatory complications: Secondary | ICD-10-CM | POA: Insufficient documentation

## 2018-12-18 DIAGNOSIS — I10 Essential (primary) hypertension: Principal | ICD-10-CM

## 2018-12-18 LAB — CMP14+EGFR
ALT: 32 IU/L (ref 0–44)
AST: 17 IU/L (ref 0–40)
Albumin/Globulin Ratio: 1.1 — ABNORMAL LOW (ref 1.2–2.2)
Albumin: 3.8 g/dL (ref 3.8–4.8)
Alkaline Phosphatase: 105 IU/L (ref 39–117)
BUN/Creatinine Ratio: 15 (ref 10–24)
BUN: 15 mg/dL (ref 8–27)
Bilirubin Total: 0.4 mg/dL (ref 0.0–1.2)
CO2: 19 mmol/L — ABNORMAL LOW (ref 20–29)
Calcium: 9.2 mg/dL (ref 8.6–10.2)
Chloride: 97 mmol/L (ref 96–106)
Creatinine, Ser: 1 mg/dL (ref 0.76–1.27)
GFR calc Af Amer: 93 mL/min/{1.73_m2} (ref >59)
GFR calc non Af Amer: 81 mL/min/{1.73_m2} (ref >59)
Globulin, Total: 3.6 g/dL (ref 1.5–4.5)
Glucose: 316 mg/dL — ABNORMAL HIGH (ref 65–99)
Potassium: 4.7 mmol/L (ref 3.5–5.2)
Sodium: 131 mmol/L — ABNORMAL LOW (ref 134–144)
Total Protein: 7.4 g/dL (ref 6.0–8.5)

## 2018-12-18 LAB — LIPID PANEL
Chol/HDL Ratio: 5.8 ratio — ABNORMAL HIGH (ref 0.0–5.0)
Cholesterol, Total: 181 mg/dL (ref 100–199)
HDL: 31 mg/dL — ABNORMAL LOW (ref >39)
LDL Calculated: 99 mg/dL (ref 0–99)
Triglycerides: 257 mg/dL — ABNORMAL HIGH (ref 0–149)
VLDL Cholesterol Cal: 51 mg/dL — ABNORMAL HIGH (ref 5–40)

## 2018-12-18 LAB — HEMOGLOBIN A1C
Est. average glucose Bld gHb Est-mCnc: 309 mg/dL
Hgb A1c MFr Bld: 12.4 % — ABNORMAL HIGH (ref 4.8–5.6)

## 2018-12-18 LAB — HEPATITIS C ANTIBODY: Hep C Virus Ab: 0.1 s/co ratio (ref 0.0–0.9)

## 2018-12-18 NOTE — Progress Notes (Signed)
Subjective:     Patient ID: Levi Roberts , male    DOB: [birth year] , 63 y.o.   MRN: [id number]   Chief Complaint  Patient presents with  . Diabetes  . Hypertension  . Hyperlipidemia    HPI  He presents today for dm f/u. He has not been here in over six months, because he was switched to a new insurance - which forced him to find a new PCP because we are out of network. He has decided to return and is willing to pay the out of pocket costs. He adds he has been out of insulin for about two weeks.   Diabetes  He presents for his follow-up diabetic visit. He has type 2 diabetes mellitus. His disease course has been stable. There are no hypoglycemic associated symptoms. Pertinent negatives for diabetes include no blurred vision and no chest pain. There are no hypoglycemic complications. Risk factors for coronary artery disease include diabetes mellitus, dyslipidemia, hypertension, male sex, obesity and sedentary lifestyle. He is compliant with treatment some of the time. He participates in exercise intermittently. His breakfast blood glucose is taken between 8-9 am. His breakfast blood glucose range is generally 130-140 mg/dl. An ACE inhibitor/angiotensin II receptor blocker is being taken.  Hypertension  This is a chronic problem. The current episode started more than 1 year ago. The problem has been gradually improving since onset. The problem is controlled. Pertinent negatives include no blurred vision, chest pain, palpitations or shortness of breath.  Hyperlipidemia  This is a chronic problem. The current episode started more than 1 year ago. The problem is controlled. Exacerbating diseases include diabetes and obesity. Pertinent negatives include no chest pain or shortness of breath. The current treatment provides moderate improvement of lipids.     Past Medical History:  Diagnosis Date  . Diabetes mellitus without complication ([place])   . Hypertension      Family History   Problem Relation Age of Onset  . Diabetes Mother   . Heart attack Mother   . Kidney cancer Father   . Diabetes Sister   . Healthy Sister      Current Outpatient Medications:  .  atorvastatin (LIPITOR) 20 MG tablet, Take 1 tablet (20 mg total) by mouth daily., Disp: 90 tablet, Rfl: 1 .  finasteride (PROSCAR) 5 MG tablet, Take 1 tablet (5 mg total) by mouth daily., Disp: 90 tablet, Rfl: 1 .  Semaglutide, 1 MG/DOSE, (OZEMPIC, 1 MG/DOSE,) 2 MG/1.5ML SOPN, Inject 1 mg into the skin once a week., Disp: 6 pen, Rfl: 1 .  telmisartan (MICARDIS) 20 MG tablet, Take 1 tablet (20 mg total) by mouth daily., Disp: 90 tablet, Rfl: 1 .  TRESIBA FLEXTOUCH 200 UNIT/ML SOPN, Inject 60 Units into the skin at bedtime., Disp: 15 pen, Rfl: 1   No Known Allergies   Review of Systems  Constitutional: Negative.   Eyes: Negative for blurred vision.  Respiratory: Negative.  Negative for shortness of breath.   Cardiovascular: Negative.  Negative for chest pain and palpitations.  Gastrointestinal: Negative.   Neurological: Negative.   Psychiatric/Behavioral: Negative.      Today's Vitals   12/17/18 0908  BP: 124/76  Pulse: 92  Temp: 98 F (36.7 C)  TempSrc: Oral  Weight: (!) 306 lb 3.2 oz (138.9 kg)  Height: '6\' 3"'  (1.905 m)   Body mass index is 38.27 kg/m.   Objective:  Physical Exam Vitals signs and nursing note reviewed.  Constitutional:      Appearance:  Normal appearance.  Cardiovascular:     Rate and Rhythm: Normal rate and regular rhythm.     Heart sounds: Normal heart sounds.  Pulmonary:     Effort: Pulmonary effort is normal.     Breath sounds: Normal breath sounds.  Skin:    General: Skin is warm.  Neurological:     General: No focal deficit present.     Mental Status: He is alert.  Psychiatric:        Mood and Affect: Mood normal.         Assessment And Plan:     1. Obesity, diabetes, and hypertension syndrome ([place])  I will check labs as listed below.  He is encouraged  to exercise no less than five days weekly to help him achieve optimal weight, bs and bp control. He is encouraged to avoid sugary beverages and processed foods. His BP is well controlled. He will continue with current meds. I will make further recommendations once his labs are available for review.   - Hepatitis C antibody - Lipid panel - CMP14+EGFR - Hemoglobin A1c  2. Pure hypercholesterolemia  He will continue with current meds. He is encouraged to avoid fried foods and to increase his fish intake.   3. Need for vaccination  - Tdap (BOOSTRIX) injection 0.5 mL  4. Screen for colon cancer  I will refer him to GI for CRC screening.   - Ambulatory referral to Gastroenterology  5. Class 2 severe obesity due to excess calories with serious comorbidity and body mass index (BMI) of 38.0 to 38.9 in adult [facility])   Importance of achieving optimal weight to decrease risk of cardiovascular disease and cancers was discussed with the patient in full detail. He is encouraged to start slowly - start with 10 minutes twice daily at least three to four days per week and to gradually build to 30 minutes five days weekly. He was given tips to incorporate more activity into her daily routine - take stairs when possible, park farther away from her job, grocery stores, etc.        Levi Greenland, MD    THE PATIENT IS ENCOURAGED TO PRACTICE SOCIAL DISTANCING DUE TO THE COVID-19 PANDEMIC.

## 2019-01-13 ENCOUNTER — Other Ambulatory Visit: Payer: Self-pay

## 2019-01-13 MED ORDER — TRESIBA FLEXTOUCH 200 UNIT/ML ~~LOC~~ SOPN: 60.0000 [IU] | PEN_INJECTOR | Freq: Every day | SUBCUTANEOUS | 1 refills | Status: DC

## 2019-03-03 ENCOUNTER — Other Ambulatory Visit: Payer: Self-pay | Admitting: Nurse Practitioner

## 2019-03-18 ENCOUNTER — Ambulatory Visit: Payer: BLUE CROSS/BLUE SHIELD | Admitting: Internal Medicine

## 2019-05-01 ENCOUNTER — Other Ambulatory Visit: Payer: Self-pay

## 2019-05-13 DIAGNOSIS — E119 Type 2 diabetes mellitus without complications: Secondary | ICD-10-CM | POA: Insufficient documentation

## 2019-05-13 DIAGNOSIS — Z794 Long term (current) use of insulin: Secondary | ICD-10-CM | POA: Insufficient documentation

## 2019-05-13 DIAGNOSIS — I10 Essential (primary) hypertension: Secondary | ICD-10-CM | POA: Insufficient documentation

## 2019-05-13 DIAGNOSIS — E1169 Type 2 diabetes mellitus with other specified complication: Secondary | ICD-10-CM | POA: Insufficient documentation

## 2019-08-04 ENCOUNTER — Other Ambulatory Visit: Payer: Self-pay | Admitting: Internal Medicine

## 2019-09-04 ENCOUNTER — Other Ambulatory Visit: Payer: Self-pay | Admitting: Internal Medicine

## 2019-10-07 ENCOUNTER — Encounter: Payer: Self-pay | Admitting: Internal Medicine

## 2019-10-07 ENCOUNTER — Ambulatory Visit (INDEPENDENT_AMBULATORY_CARE_PROVIDER_SITE_OTHER): Payer: 59 | Admitting: Internal Medicine

## 2019-10-07 ENCOUNTER — Other Ambulatory Visit: Payer: Self-pay | Admitting: Internal Medicine

## 2019-10-07 ENCOUNTER — Other Ambulatory Visit: Payer: Self-pay

## 2019-10-07 VITALS — BP 120/76 | HR 93 | Temp 98.0°F | Ht 75.0 in | Wt 314.4 lb

## 2019-10-07 DIAGNOSIS — R319 Hematuria, unspecified: Secondary | ICD-10-CM | POA: Diagnosis not present

## 2019-10-07 DIAGNOSIS — E119 Type 2 diabetes mellitus without complications: Secondary | ICD-10-CM

## 2019-10-07 DIAGNOSIS — E669 Obesity, unspecified: Secondary | ICD-10-CM

## 2019-10-07 DIAGNOSIS — E66812 Obesity, class 2: Secondary | ICD-10-CM

## 2019-10-07 DIAGNOSIS — I152 Hypertension secondary to endocrine disorders: Secondary | ICD-10-CM

## 2019-10-07 DIAGNOSIS — E1159 Type 2 diabetes mellitus with other circulatory complications: Secondary | ICD-10-CM

## 2019-10-07 DIAGNOSIS — Z6839 Body mass index (BMI) 39.0-39.9, adult: Secondary | ICD-10-CM

## 2019-10-07 DIAGNOSIS — I10 Essential (primary) hypertension: Secondary | ICD-10-CM

## 2019-10-07 DIAGNOSIS — E1169 Type 2 diabetes mellitus with other specified complication: Secondary | ICD-10-CM

## 2019-10-07 DIAGNOSIS — K5909 Other constipation: Secondary | ICD-10-CM

## 2019-10-07 LAB — POCT URINALYSIS DIPSTICK
Bilirubin, UA: NEGATIVE
Glucose, UA: NEGATIVE
Ketones, UA: NEGATIVE
Nitrite, UA: NEGATIVE
Protein, UA: POSITIVE — AB
Spec Grav, UA: 1.025 (ref 1.010–1.025)
Urobilinogen, UA: 0.2 E.U./dL
pH, UA: 5.5 (ref 5.0–8.0)

## 2019-10-07 MED ORDER — ATORVASTATIN CALCIUM 20 MG PO TABS: ORAL_TABLET | ORAL | 0 refills | Status: DC

## 2019-10-07 MED ORDER — TELMISARTAN 20 MG PO TABS: 20.0000 mg | ORAL_TABLET | Freq: Every day | ORAL | 0 refills | Status: DC

## 2019-10-07 NOTE — Progress Notes (Signed)
This visit occurred during the SARS-CoV-2 public health emergency.  Safety protocols were in place, including screening questions prior to the visit, additional usage of staff PPE, and extensive cleaning of exam room while observing appropriate contact time as indicated for disinfecting solutions.  Subjective:     Patient ID: Levi Roberts , male    DOB: 21-Mar-1956 , 64 y.o.   MRN: [id number]   Chief Complaint  Patient presents with  . Diabetes  . Hypertension  . Immunizations    pneumonia    HPI  He presents today for dm f/u. He has not been here in over six months, because he was switched to a new insurance - which forced him to find a new PCP because we are out of network. He has decided to return and is willing to pay the out of pocket costs. He adds he has been out of insulin for about two weeks.   Diabetes He presents for his follow-up diabetic visit. He has type 2 diabetes mellitus. His disease course has been stable. There are no hypoglycemic associated symptoms. Pertinent negatives for diabetes include no blurred vision. There are no hypoglycemic complications. Risk factors for coronary artery disease include diabetes mellitus, dyslipidemia, hypertension, male sex, obesity and sedentary lifestyle. He is compliant with treatment some of the time. He is following a diabetic diet. He participates in exercise intermittently. His breakfast blood glucose is taken between 8-9 am. His breakfast blood glucose range is generally 130-140 mg/dl. An ACE inhibitor/angiotensin II receptor blocker is being taken.  Hypertension This is a chronic problem. The current episode started more than 1 year ago. The problem has been gradually improving since onset. The problem is controlled. Pertinent negatives include no blurred vision or palpitations. Risk factors for coronary artery disease include diabetes mellitus, dyslipidemia, obesity and sedentary lifestyle. Past treatments include angiotensin  blockers. The current treatment provides moderate improvement. Compliance problems include exercise.      Past Medical History:  Diagnosis Date  . Diabetes mellitus without complication ([place])   . Hypertension      Family History  Problem Relation Age of Onset  . Diabetes Mother   . Heart attack Mother   . Kidney cancer Father   . Diabetes Sister   . Healthy Sister      Current Outpatient Medications:  .  atorvastatin (LIPITOR) 20 MG tablet, TAKE 1 TABLET BY MOUTH ONCE DAILY . APPOINTMENT REQUIRED FOR FUTURE REFILLS, Disp: 30 tablet, Rfl: 0 .  finasteride (PROSCAR) 5 MG tablet, Take 1 tablet (5 mg total) by mouth daily., Disp: 90 tablet, Rfl: 1 .  metFORMIN (GLUCOPHAGE) 500 MG tablet, Take 500 mg by mouth in the morning and at bedtime. Take 2 tabs po bid, Disp: , Rfl:  .  Semaglutide, 1 MG/DOSE, (OZEMPIC, 1 MG/DOSE,) 2 MG/1.5ML SOPN, Inject 1 mg into the skin once a week., Disp: 6 pen, Rfl: 1 .  telmisartan (MICARDIS) 20 MG tablet, Take 1 tablet by mouth once daily, Disp: 30 tablet, Rfl: 0 .  TRESIBA FLEXTOUCH 200 UNIT/ML SOPN, INJECT 60 UNITS SUBCUTANEOUSLY AT BEDTIME, Disp: 15 mL, Rfl: 0   No Known Allergies   Review of Systems  Constitutional: Negative.   Eyes: Negative for blurred vision.  Respiratory: Negative.   Cardiovascular: Negative.  Negative for palpitations.  Gastrointestinal: Positive for constipation.       He c/o constipation. Sx started several months ago. He denies seeing blood in his stools. No associated abdominal discomfort.   Genitourinary: Positive  for hematuria.       He c/o recurrent episodes of hematuria. He reports he has discussed several times with his urologist at [facility], but no testing has been ordered. He denies h/o kidney stones.   Neurological: Negative.   Psychiatric/Behavioral: Negative.      Today's Vitals   10/07/19 1144  BP: 120/76  Pulse: 93  Temp: 98 F (36.7 C)  TempSrc: Oral  Weight: (!) 314 lb 6.4 oz (142.6 kg)   Height: 6' 3" (1.905 m)   Body mass index is 39.3 kg/m.   Wt Readings from Last 3 Encounters:  10/07/19 (!) 314 lb 6.4 oz (142.6 kg)  12/17/18 (!) 306 lb 3.2 oz (138.9 kg)  10/15/17 (!) 319 lb (144.7 kg)    Objective:  Physical Exam Vitals and nursing note reviewed.  Constitutional:      Appearance: Normal appearance. He is obese.  Cardiovascular:     Rate and Rhythm: Normal rate and regular rhythm.     Heart sounds: Normal heart sounds.  Pulmonary:     Effort: Pulmonary effort is normal.     Breath sounds: Normal breath sounds.  Skin:    General: Skin is warm.  Neurological:     General: No focal deficit present.     Mental Status: He is alert.  Psychiatric:        Mood and Affect: Mood normal.         Assessment And Plan:     1. Obesity, diabetes, and hypertension syndrome ([place])  I reviewed his records at [facility] in detail during his visit. He is encouraged to avoid drinking sodas to help him reach glycemic goal. I will check labs as listed below. I will check labs as listed below. He is advised to increase Tresiba to 65 units and to check fasting BS daily and fax results to me every Monday so his dose can be adjusted.   His blood pressure is well controlled.  He is encouraged to avoid adding salt to his foods.   BMI 39 - he is encouraged to exercise 30 minutes five days per week.    - CMP14+EGFR - Hemoglobin A1c  2. Class 2 severe obesity due to excess calories with serious comorbidity and body mass index (BMI) of 39.0 to 39.9 in adult [facility])  He is encouraged to strive for BMI less than 32 to decrease cardiac risk. He admits to drinking at least 2 cans of soda daily, encouraged to cut back and increase his water intake.   3. Chronic constipation  He is advised to try Miralax once daily. If his sx persist, I will try him on Linzess 171m daily. He is encouraged to also increase his water and fiber intake.   4. Hematuria, unspecified type  Urine  pos for RBCs today. I will also refer him to another Urology practice for further evaluation.   - POCT Urinalysis Dipstick (([id number] - Ambulatory referral to Urology       RMaximino Greenland MD    THE PATIENT IS ENCOURAGED TO PRACTICE SOCIAL DISTANCING DUE TO THE COVID-19 PANDEMIC.

## 2019-10-07 NOTE — Patient Instructions (Addendum)
Email me your fasting sugars every Monday  I will adjust your [name] accordingly   Start Linzess upon awakening, wait 30 minutes before eating/drinking anything  Start with 72mg  dose first, increase water intake.   If not effective after 2-3 days, increase to 145mg  dosage.   Constipation, Adult Constipation is when a person:  Poops (has a bowel movement) fewer times in a week than normal.  Has a hard time pooping.  Has poop that is dry, hard, or bigger than normal. Follow these instructions at home: Eating and drinking   Eat foods that have a lot of fiber, such as: ? Fresh fruits and vegetables. ? Whole grains. ? Beans.  Eat less of foods that are high in fat, low in fiber, or overly processed, such as: ? fries. ? Hamburgers. ? Cookies. ? Candy. ? Soda.  Drink enough fluid to keep your pee (urine) clear or pale yellow. General instructions  Exercise regularly or as told by your doctor.  Go to the restroom when you feel like you need to poop. Do not hold it in.  Take over-the-counter and prescription medicines only as told by your doctor. These include any fiber supplements.  Do pelvic floor retraining exercises, such as: ? Doing deep breathing while relaxing your lower belly (abdomen). ? Relaxing your pelvic floor while pooping.  Watch your condition for any changes.  Keep all follow-up visits as told by your doctor. This is important. Contact a doctor if:  You have pain that gets worse.  You have a fever.  You have not pooped for 4 days.  You throw up (vomit).  You are not hungry.  You lose weight.  You are bleeding from the anus.  You have thin, pencil-like poop (stool). Get help right away if:  You have a fever, and your symptoms suddenly get worse.  You leak poop or have blood in your poop.  Your belly feels hard or bigger than normal (is bloated).  You have very bad belly pain.  You feel dizzy or you faint. This information is  not intended to replace advice given to you by your health care provider. Make sure you discuss any questions you have with your health care provider. Document Revised: 07/19/2017 Document Reviewed: 01/25/2016 Elsevier Patient Education  2020 07/21/2017.

## 2019-10-08 ENCOUNTER — Encounter: Payer: Self-pay | Admitting: Internal Medicine

## 2019-10-08 LAB — CMP14+EGFR
ALT: 19 IU/L (ref 0–44)
AST: 13 IU/L (ref 0–40)
Albumin/Globulin Ratio: 1.1 — ABNORMAL LOW (ref 1.2–2.2)
Albumin: 3.9 g/dL (ref 3.8–4.8)
Alkaline Phosphatase: 90 IU/L (ref 39–117)
BUN/Creatinine Ratio: 11 (ref 10–24)
BUN: 11 mg/dL (ref 8–27)
Bilirubin Total: 0.5 mg/dL (ref 0.0–1.2)
CO2: 23 mmol/L (ref 20–29)
Calcium: 9.5 mg/dL (ref 8.6–10.2)
Chloride: 99 mmol/L (ref 96–106)
Creatinine, Ser: 0.99 mg/dL (ref 0.76–1.27)
GFR calc Af Amer: 94 mL/min/{1.73_m2} (ref >59)
GFR calc non Af Amer: 81 mL/min/{1.73_m2} (ref >59)
Globulin, Total: 3.4 g/dL (ref 1.5–4.5)
Glucose: 225 mg/dL — ABNORMAL HIGH (ref 65–99)
Potassium: 4.9 mmol/L (ref 3.5–5.2)
Sodium: 138 mmol/L (ref 134–144)
Total Protein: 7.3 g/dL (ref 6.0–8.5)

## 2019-10-08 LAB — HEMOGLOBIN A1C
Est. average glucose Bld gHb Est-mCnc: 292 mg/dL
Hgb A1c MFr Bld: 11.8 % — ABNORMAL HIGH (ref 4.8–5.6)

## 2019-10-09 ENCOUNTER — Encounter: Payer: Self-pay | Admitting: Internal Medicine

## 2019-10-12 ENCOUNTER — Encounter: Payer: Self-pay | Admitting: Internal Medicine

## 2019-10-27 ENCOUNTER — Other Ambulatory Visit: Payer: Self-pay | Admitting: Nurse Practitioner

## 2019-10-27 MED ORDER — TRESIBA FLEXTOUCH 200 UNIT/ML ~~LOC~~ SOPN: PEN_INJECTOR | SUBCUTANEOUS | 0 refills | Status: DC

## 2019-11-09 ENCOUNTER — Encounter: Payer: Self-pay | Admitting: Urology

## 2019-11-09 ENCOUNTER — Other Ambulatory Visit: Payer: Self-pay

## 2019-11-09 ENCOUNTER — Ambulatory Visit (INDEPENDENT_AMBULATORY_CARE_PROVIDER_SITE_OTHER): Payer: 59 | Admitting: Urology

## 2019-11-09 VITALS — BP 151/81 | HR 91 | Ht 75.0 in | Wt 314.0 lb

## 2019-11-09 DIAGNOSIS — N368 Other specified disorders of urethra: Secondary | ICD-10-CM

## 2019-11-09 LAB — URINALYSIS, COMPLETE
Bilirubin, UA: NEGATIVE
Ketones, UA: NEGATIVE
Nitrite, UA: NEGATIVE
Protein,UA: NEGATIVE
Specific Gravity, UA: >1.03 — ABNORMAL HIGH (ref 1.005–1.030)
Urobilinogen, Ur: 0.2 mg/dL (ref 0.2–1.0)
pH, UA: 5 (ref 5.0–7.5)

## 2019-11-09 LAB — MICROSCOPIC EXAMINATION: WBC, UA: >30 /hpf — AB (ref 0–5)

## 2019-11-09 NOTE — Progress Notes (Signed)
11/09/2019 9:13 PM   Kathalene Frames 1956/06/04 [id number]  Referring provider: [place] Chard, [address] [place],  [place] [address]  Chief Complaint  Patient presents with  . Hematuria    HPI: Levi Roberts is a 64 y.o. male seen at the request of Dr. Baird Cancer for evaluation of hematuria.    -Intermittent urethral bleeding associated only with straining when constipated -After that episode his next void he will pass a few small blood clots but no isolated gross hematuria -Last episodes February 2021, April 2020 -Estimates it has happened ~3 times per year for the last 3-4 years -No dysuria -Denies flank, abdominal or pelvic pain -Has been followed by Dr. Karsten Ro at [facility] for BPH and an elevated PSA; 2 prior negative prostate biopsies -Mild LUTS frequency, decreased stream and postvoid dribbling -On finasteride and sildenafil  PMH: Past Medical History:  Diagnosis Date  . Diabetes mellitus without complication ([place])   . Hypertension     Surgical History: No past surgical history on file.  Home Medications:  Allergies as of 11/09/2019   No Known Allergies     Medication List       Accurate as of November 09, 2019  9:13 PM. If you have any questions, ask your nurse or doctor.        atorvastatin 20 MG tablet Commonly known as: LIPITOR Take 1 tablet by mouth daily   finasteride 5 MG tablet Commonly known as: PROSCAR Take 1 tablet (5 mg total) by mouth daily.   metFORMIN 500 MG tablet Commonly known as: GLUCOPHAGE Take 500 mg by mouth in the morning and at bedtime. Take 2 tabs po bid   Semaglutide (1 MG/DOSE) 2 MG/1.5ML Sopn Commonly known as: Ozempic (1 MG/DOSE) Inject 1 mg into the skin once a week.   sildenafil 100 MG tablet Commonly known as: VIAGRA Take by mouth.   telmisartan 20 MG tablet Commonly known as: MICARDIS Take 1 tablet (20 mg total) by mouth daily.   Tyler Aas FlexTouch 200 UNIT/ML FlexTouch  Pen Generic drug: insulin degludec INJECT 60 UNITS SUBCUTANEOUSLY AT BEDTIME       Allergies: No Known Allergies  Family History: Family History  Problem Relation Age of Onset  . Diabetes Mother   . Heart attack Mother   . Kidney cancer Father   . Diabetes Sister   . Healthy Sister     Social History:  reports that he has never smoked. He has never used smokeless tobacco. He reports that he does not drink alcohol or use drugs.   Physical Exam: BP (!) 151/81   Pulse 91   Ht 6\' 3"  (1.905 m)   Wt (!) 314 lb (142.4 kg)   BMI 39.25 kg/m   Constitutional:  Alert and oriented, No acute distress. HEENT: [place] AT, moist mucus membranes.  Trachea midline, no masses. Cardiovascular: No clubbing, cyanosis, or edema. Respiratory: Normal respiratory effort, no increased work of breathing. GI: Abdomen is soft, nontender, nondistended, no abdominal masses GU: Prostate difficult to palpate secondary to body habitus no induration or nodules that apex Skin: No rashes, bruises or suspicious lesions. Neurologic: Grossly intact, no focal deficits, moving all 4 extremities. Psychiatric: Normal mood and affect.   Assessment & Plan:    - Urethral bleeding Intermittent episodes of urethral bleeding only occurring after straining when constipated which would indicate a source from the prostate distally, most likely secondary to BPH.  Since no gross hematuria per se I recommended a renal  ultrasound and lieu of CT urogram along with a cystoscopy for further evaluation.  - History elevated PSA Previously followed by Dr. Vernie Ammons.  Will review records  Erectile dysfunction -On sildenafil  - Pyuria Urinalysis today with 1+ leukocytes, >30 WBCs on microscopy.  No microhematuria.  Urine culture was ordered.   Riki Altes, MD  [facility] [address] [place], [place] [address] [phone]

## 2019-11-10 ENCOUNTER — Encounter: Payer: Self-pay | Admitting: Urology

## 2019-11-11 LAB — CULTURE, URINE COMPREHENSIVE

## 2019-11-12 ENCOUNTER — Telehealth: Payer: Self-pay | Admitting: *Deleted

## 2019-11-12 NOTE — Telephone Encounter (Signed)
-----   Message from Riki Altes, MD sent at 11/11/2019 10:16 PM EDT ----- Urine culture showed no significant growth

## 2019-11-12 NOTE — Telephone Encounter (Signed)
Notified patient as instructed, patient pleased. Discussed follow-up appointments, patient agrees  

## 2019-11-24 ENCOUNTER — Other Ambulatory Visit: Payer: Self-pay | Admitting: Nurse Practitioner

## 2019-11-24 MED ORDER — TRESIBA FLEXTOUCH 200 UNIT/ML ~~LOC~~ SOPN: PEN_INJECTOR | SUBCUTANEOUS | 0 refills | Status: DC

## 2019-12-10 ENCOUNTER — Other Ambulatory Visit: Payer: Self-pay | Admitting: Urology

## 2019-12-21 ENCOUNTER — Other Ambulatory Visit: Payer: Self-pay | Admitting: Nurse Practitioner

## 2019-12-22 ENCOUNTER — Ambulatory Visit (HOSPITAL_COMMUNITY)
Admission: RE | Admit: 2019-12-22 | Discharge: 2019-12-22 | Disposition: A | Discharge status: Home / Self Care | Payer: 59 | Source: Ambulatory Visit | Attending: Urology | Admitting: Urology

## 2019-12-22 ENCOUNTER — Other Ambulatory Visit: Payer: Self-pay

## 2019-12-22 DIAGNOSIS — N368 Other specified disorders of urethra: Secondary | ICD-10-CM | POA: Diagnosis not present

## 2019-12-22 MED ORDER — TRESIBA FLEXTOUCH 200 UNIT/ML ~~LOC~~ SOPN: PEN_INJECTOR | SUBCUTANEOUS | 0 refills | Status: DC

## 2019-12-24 ENCOUNTER — Encounter: Payer: Self-pay | Admitting: Urology

## 2019-12-24 ENCOUNTER — Other Ambulatory Visit: Payer: Self-pay

## 2019-12-24 ENCOUNTER — Ambulatory Visit (INDEPENDENT_AMBULATORY_CARE_PROVIDER_SITE_OTHER): Payer: 59 | Admitting: Urology

## 2019-12-24 VITALS — BP 111/80 | HR 93 | Ht 75.0 in | Wt 314.0 lb

## 2019-12-24 DIAGNOSIS — N368 Other specified disorders of urethra: Secondary | ICD-10-CM | POA: Diagnosis not present

## 2019-12-24 LAB — URINALYSIS, COMPLETE
Bilirubin, UA: NEGATIVE
Ketones, UA: NEGATIVE
Nitrite, UA: NEGATIVE
Protein,UA: NEGATIVE
Specific Gravity, UA: 1.025 (ref 1.005–1.030)
Urobilinogen, Ur: 0.2 mg/dL (ref 0.2–1.0)
pH, UA: 5.5 (ref 5.0–7.5)

## 2019-12-24 LAB — MICROSCOPIC EXAMINATION: WBC, UA: >30 /hpf — AB (ref 0–5)

## 2019-12-24 MED ORDER — FINASTERIDE 5 MG PO TABS: 5.0000 mg | ORAL_TABLET | Freq: Every day | ORAL | 3 refills | Status: DC

## 2019-12-24 NOTE — Progress Notes (Signed)
   12/24/19  CC:  Chief Complaint  Patient presents with  . Cysto    HPI: 64 yo male with urethral bleeding occurring after bowel movements.  Renal ultrasound showed no upper tract abnormalities and prostate enlargement with an estimated volume of 191 cc.  Blood pressure 111/80, pulse 93, height 6\' 3"  (1.905 m), weight (!) 314 lb (142.4 kg). NED. A&Ox3.   No respiratory distress   Abd soft, NT, ND Normal phallus with bilateral descended testicles  Cystoscopy Procedure Note  Patient identification was confirmed, informed consent was obtained, and patient was prepped using Betadine solution.  Lidocaine jelly was administered per urethral meatus.     Pre-Procedure: - Inspection reveals a normal caliber urethral meatus.  Procedure: The flexible cystoscope was introduced without difficulty - No urethral strictures/lesions are present. - Marked lateral lobe large mid prostate  - Elevated bladder neck - Bilateral ureteral orifices identified - Bladder mucosa  reveals no ulcers, tumors, or lesions - No bladder stones -Moderate trabeculation  Unable to retroflex scope due to body habitus and urethral length   Post-Procedure: - Patient tolerated the procedure well  Assessment/ Plan: -Urethral bleeding secondary to BPH -He had been on finasteride for 5 years and ran out 6 months ago and desires to restart -He will sign a release for his [facility] records -Follow-up 1 year or as needed for any significant change   , MD

## 2020-01-03 ENCOUNTER — Telehealth: Payer: Self-pay | Admitting: Urology

## 2020-01-03 NOTE — Telephone Encounter (Signed)
[facility] reviewed.  Last PSA was in 2019.  Please schedule lab visit for PSA

## 2020-01-04 ENCOUNTER — Ambulatory Visit: Payer: 59 | Admitting: Internal Medicine

## 2020-01-06 ENCOUNTER — Other Ambulatory Visit: Payer: Self-pay | Admitting: Internal Medicine

## 2020-01-06 MED ORDER — ATORVASTATIN CALCIUM 20 MG PO TABS: ORAL_TABLET | ORAL | 0 refills | Status: DC

## 2020-01-06 MED ORDER — TELMISARTAN 20 MG PO TABS: 20.0000 mg | ORAL_TABLET | Freq: Every day | ORAL | 0 refills | Status: DC

## 2020-01-11 ENCOUNTER — Other Ambulatory Visit: Payer: Self-pay

## 2020-01-11 ENCOUNTER — Encounter: Payer: Self-pay | Admitting: Internal Medicine

## 2020-01-11 ENCOUNTER — Other Ambulatory Visit: Payer: 59

## 2020-01-11 ENCOUNTER — Ambulatory Visit (INDEPENDENT_AMBULATORY_CARE_PROVIDER_SITE_OTHER): Payer: 59 | Admitting: Internal Medicine

## 2020-01-11 VITALS — BP 134/78 | HR 91 | Temp 97.9°F | Ht 75.0 in | Wt 316.0 lb

## 2020-01-11 DIAGNOSIS — N368 Other specified disorders of urethra: Secondary | ICD-10-CM

## 2020-01-11 DIAGNOSIS — Z1211 Encounter for screening for malignant neoplasm of colon: Secondary | ICD-10-CM | POA: Diagnosis not present

## 2020-01-11 DIAGNOSIS — I152 Hypertension secondary to endocrine disorders: Secondary | ICD-10-CM

## 2020-01-11 DIAGNOSIS — E7800 Pure hypercholesterolemia, unspecified: Secondary | ICD-10-CM | POA: Diagnosis not present

## 2020-01-11 DIAGNOSIS — Z6839 Body mass index (BMI) 39.0-39.9, adult: Secondary | ICD-10-CM

## 2020-01-11 DIAGNOSIS — E1159 Type 2 diabetes mellitus with other circulatory complications: Secondary | ICD-10-CM

## 2020-01-11 DIAGNOSIS — E669 Obesity, unspecified: Secondary | ICD-10-CM

## 2020-01-11 DIAGNOSIS — E1169 Type 2 diabetes mellitus with other specified complication: Secondary | ICD-10-CM | POA: Diagnosis not present

## 2020-01-11 DIAGNOSIS — I10 Essential (primary) hypertension: Secondary | ICD-10-CM

## 2020-01-11 NOTE — Progress Notes (Signed)
This visit occurred during the SARS-CoV-2 public health emergency.  Safety protocols were in place, including screening questions prior to the visit, additional usage of staff PPE, and extensive cleaning of exam room while observing appropriate contact time as indicated for disinfecting solutions.  Subjective:     Patient ID: Levi Roberts , male    DOB: [birth year] , 64 y.o.   MRN: [id number]   Chief Complaint  Patient presents with  . Diabetes  . Hypertension    HPI  He presents today for dm f/u. He has not been here in over six months, because he was switched to a new insurance - which forced him to find a new PCP because we are out of network. He has decided to return and is willing to pay the out of pocket costs. He adds he has been out of insulin for about two weeks.   Diabetes He presents for his follow-up diabetic visit. He has type 2 diabetes mellitus. His disease course has been stable. There are no hypoglycemic associated symptoms. Pertinent negatives for diabetes include no blurred vision and no chest pain. There are no hypoglycemic complications. Risk factors for coronary artery disease include diabetes mellitus, dyslipidemia, hypertension, male sex, obesity and sedentary lifestyle. He is compliant with treatment some of the time. He participates in exercise intermittently. His breakfast blood glucose is taken between 8-9 am. His breakfast blood glucose range is generally 130-140 mg/dl. An ACE inhibitor/angiotensin II receptor blocker is being taken.  Hypertension This is a chronic problem. The current episode started more than 1 year ago. The problem has been gradually improving since onset. The problem is controlled. Pertinent negatives include no blurred vision, chest pain, palpitations or shortness of breath.  Hyperlipidemia This is a chronic problem. The current episode started more than 1 year ago. The problem is controlled. Exacerbating diseases include diabetes and  obesity. Pertinent negatives include no chest pain or shortness of breath. The current treatment provides moderate improvement of lipids.     Past Medical History:  Diagnosis Date  . Diabetes mellitus without complication ([place])   . Hypertension      Family History  Problem Relation Age of Onset  . Diabetes Mother   . Heart attack Mother   . Kidney cancer Father   . Diabetes Sister   . Healthy Sister      Current Outpatient Medications:  .  atorvastatin (LIPITOR) 20 MG tablet, Take 1 tablet by mouth daily, Disp: 90 tablet, Rfl: 0 .  finasteride (PROSCAR) 5 MG tablet, Take 1 tablet (5 mg total) by mouth daily., Disp: 90 tablet, Rfl: 3 .  metFORMIN (GLUCOPHAGE) 500 MG tablet, Take 500 mg by mouth in the morning and at bedtime. Take 2 tabs po bid, Disp: , Rfl:  .  Semaglutide, 1 MG/DOSE, (OZEMPIC, 1 MG/DOSE,) 2 MG/1.5ML SOPN, Inject 1 mg into the skin once a week., Disp: 6 pen, Rfl: 1 .  telmisartan (MICARDIS) 20 MG tablet, Take 1 tablet (20 mg total) by mouth daily., Disp: 90 tablet, Rfl: 0 .  TRESIBA FLEXTOUCH 200 UNIT/ML FlexTouch Pen, INJECT 60 UNITS SUBCUTANEOUSLY AT BEDTIME, Disp: 15 mL, Rfl: 0   No Known Allergies   Review of Systems  Constitutional: Negative.   Eyes: Negative for blurred vision.  Respiratory: Negative.  Negative for shortness of breath.   Cardiovascular: Negative.  Negative for chest pain and palpitations.  Gastrointestinal: Negative.   Neurological: Negative.   Psychiatric/Behavioral: Negative.      Today's Vitals  01/11/20 1146  BP: 134/78  Pulse: 91  Temp: 97.9 F (36.6 C)  TempSrc: Oral  Weight: (!) 316 lb (143.3 kg)  Height: '6\' 3"'  (1.905 m)   Body mass index is 39.5 kg/m.   Objective:  Physical Exam Vitals and nursing note reviewed.  Constitutional:      Appearance: Normal appearance. He is obese.  Cardiovascular:     Rate and Rhythm: Normal rate and regular rhythm.     Heart sounds: Normal heart sounds.  Pulmonary:     Effort:  Pulmonary effort is normal.     Breath sounds: Normal breath sounds.  Skin:    General: Skin is warm.  Neurological:     General: No focal deficit present.     Mental Status: He is alert.  Psychiatric:        Mood and Affect: Mood normal.         Assessment And Plan:     1. Obesity, diabetes, and hypertension syndrome ([place])  Chronic, I will refer him for diabetes eye exam, he sees [facility].  I will check labs as listed below.  Importance of compliance with meds, diet was discussed with the patient. He is encouraged to exercise 30 minutes five days per week. BP is fairly controlled. He is encouraged to avoid adding salt to his foods.   - Ambulatory referral to Optometry - CMP14+EGFR - Hemoglobin A1c - Lipid panel  2. Pure hypercholesterolemia  Chronic. I will check non-fasting lipid panel.  He is encouraged to avoid fried foods  3. Class 2 severe obesity due to excess calories with serious comorbidity and body mass index (BMI) of 39.0 to 39.9 in adult [facility])  Chronic, he is encouraged to initially strive for BMI less than 34 to decrease cardiac risk. Advised to aim for 30 minutes of exercise five days per week.   4. Screen for colon cancer  I will refer him for CRC screening. He is agreeable to the referral.   - Ambulatory referral to Gastroenterology        [name], MD    THE PATIENT IS ENCOURAGED TO PRACTICE SOCIAL DISTANCING DUE TO THE COVID-19 PANDEMIC.

## 2020-01-11 NOTE — Patient Instructions (Signed)

## 2020-01-12 LAB — CMP14+EGFR
ALT: 13 IU/L (ref 0–44)
AST: 12 IU/L (ref 0–40)
Albumin/Globulin Ratio: 1.2 (ref 1.2–2.2)
Albumin: 4.1 g/dL (ref 3.8–4.8)
Alkaline Phosphatase: 78 IU/L (ref 48–121)
BUN/Creatinine Ratio: 14 (ref 10–24)
BUN: 14 mg/dL (ref 8–27)
Bilirubin Total: 0.5 mg/dL (ref 0.0–1.2)
CO2: 23 mmol/L (ref 20–29)
Calcium: 9.5 mg/dL (ref 8.6–10.2)
Chloride: 101 mmol/L (ref 96–106)
Creatinine, Ser: 1 mg/dL (ref 0.76–1.27)
GFR calc Af Amer: 93 mL/min/{1.73_m2} (ref >59)
GFR calc non Af Amer: 80 mL/min/{1.73_m2} (ref >59)
Globulin, Total: 3.4 g/dL (ref 1.5–4.5)
Glucose: 167 mg/dL — ABNORMAL HIGH (ref 65–99)
Potassium: 4.9 mmol/L (ref 3.5–5.2)
Sodium: 135 mmol/L (ref 134–144)
Total Protein: 7.5 g/dL (ref 6.0–8.5)

## 2020-01-12 LAB — HEMOGLOBIN A1C
Est. average glucose Bld gHb Est-mCnc: 255 mg/dL
Hgb A1c MFr Bld: 10.5 % — ABNORMAL HIGH (ref 4.8–5.6)

## 2020-01-12 LAB — LIPID PANEL
Chol/HDL Ratio: 3.3 ratio (ref 0.0–5.0)
Cholesterol, Total: 108 mg/dL (ref 100–199)
HDL: 33 mg/dL — ABNORMAL LOW (ref >39)
LDL Chol Calc (NIH): 53 mg/dL (ref 0–99)
Triglycerides: 121 mg/dL (ref 0–149)
VLDL Cholesterol Cal: 22 mg/dL (ref 5–40)

## 2020-01-12 LAB — PSA: Prostate Specific Ag, Serum: 6.3 ng/mL — ABNORMAL HIGH (ref 0.0–4.0)

## 2020-01-15 ENCOUNTER — Telehealth: Payer: Self-pay | Admitting: Family Medicine

## 2020-01-15 NOTE — Telephone Encounter (Signed)
-----   Message from Riki Altes, MD sent at 01/14/2020  8:02 PM EDT ----- PSA stable 6.3

## 2020-01-15 NOTE — Telephone Encounter (Signed)
Patient notified and voiced understanding.

## 2020-01-25 ENCOUNTER — Other Ambulatory Visit: Payer: Self-pay | Admitting: Internal Medicine

## 2020-01-25 MED ORDER — TRESIBA FLEXTOUCH 200 UNIT/ML ~~LOC~~ SOPN: PEN_INJECTOR | SUBCUTANEOUS | 0 refills | Status: DC

## 2020-04-18 ENCOUNTER — Other Ambulatory Visit: Payer: Self-pay | Admitting: Internal Medicine

## 2020-04-18 MED ORDER — ATORVASTATIN CALCIUM 20 MG PO TABS: ORAL_TABLET | ORAL | 0 refills | Status: DC

## 2020-04-18 MED ORDER — TELMISARTAN 20 MG PO TABS: 20.0000 mg | ORAL_TABLET | Freq: Every day | ORAL | 0 refills | Status: DC

## 2020-04-19 ENCOUNTER — Encounter: Payer: Self-pay | Admitting: Internal Medicine

## 2020-04-19 ENCOUNTER — Ambulatory Visit (INDEPENDENT_AMBULATORY_CARE_PROVIDER_SITE_OTHER): Payer: 59 | Admitting: Internal Medicine

## 2020-04-19 ENCOUNTER — Other Ambulatory Visit: Payer: Self-pay

## 2020-04-19 VITALS — BP 140/72 | HR 95 | Temp 97.7°F | Ht 75.0 in | Wt 310.4 lb

## 2020-04-19 DIAGNOSIS — Z6838 Body mass index (BMI) 38.0-38.9, adult: Secondary | ICD-10-CM

## 2020-04-19 DIAGNOSIS — E1169 Type 2 diabetes mellitus with other specified complication: Secondary | ICD-10-CM

## 2020-04-19 DIAGNOSIS — E669 Obesity, unspecified: Secondary | ICD-10-CM | POA: Diagnosis not present

## 2020-04-19 DIAGNOSIS — I10 Essential (primary) hypertension: Secondary | ICD-10-CM | POA: Diagnosis not present

## 2020-04-19 NOTE — Progress Notes (Signed)
I,Tianna Badgett,acting as a Education administrator for [name], MD.,have documented all relevant documentation on the behalf of [name], MD,as directed by  [name], MD while in the presence of [name], MD.  This visit occurred during the SARS-CoV-2 public health emergency.  Safety protocols were in place, including screening questions prior to the visit, additional usage of staff PPE, and extensive cleaning of exam room while observing appropriate contact time as indicated for disinfecting solutions.  Subjective:     Patient ID: Levi Roberts , male    DOB: 1956/05/06 , 64 y.o.   MRN: [id number]   Chief Complaint  Patient presents with  . Diabetes  . Hypertension    HPI  He presents today for dm and htn f/u. Patient does not have any concerns at this time. Reports compliance with meds.   Diabetes He presents for his follow-up diabetic visit. He has type 2 diabetes mellitus. His disease course has been stable. There are no hypoglycemic associated symptoms. Pertinent negatives for diabetes include no blurred vision. There are no hypoglycemic complications. Risk factors for coronary artery disease include diabetes mellitus, dyslipidemia, hypertension, male sex, obesity and sedentary lifestyle. He is compliant with treatment some of the time. He is following a diabetic diet. He participates in exercise intermittently. His breakfast blood glucose is taken between 8-9 am. His breakfast blood glucose range is generally 130-140 mg/dl. An ACE inhibitor/angiotensin II receptor blocker is being taken.  Hypertension This is a chronic problem. The current episode started more than 1 year ago. The problem has been gradually improving since onset. The problem is controlled. Pertinent negatives include no blurred vision or palpitations. Risk factors for coronary artery disease include diabetes mellitus, dyslipidemia, obesity and sedentary lifestyle. Past treatments include angiotensin  blockers. The current treatment provides moderate improvement. Compliance problems include exercise.      Past Medical History:  Diagnosis Date  . Diabetes mellitus without complication ([place])   . High cholesterol   . Hypertension      Family History  Problem Relation Age of Onset  . Diabetes Mother   . Heart attack Mother   . Kidney cancer Father   . Diabetes Sister   . Healthy Sister      Current Outpatient Medications:  .  finasteride (PROSCAR) 5 MG tablet, Take 1 tablet (5 mg total) by mouth daily., Disp: 90 tablet, Rfl: 3 .  metFORMIN (GLUCOPHAGE) 500 MG tablet, Take 500 mg by mouth in the morning and at bedtime. Take 2 tabs po bid, Disp: , Rfl:  .  Semaglutide, 1 MG/DOSE, (OZEMPIC, 1 MG/DOSE,) 2 MG/1.5ML SOPN, Inject 1 mg into the skin once a week., Disp: 6 pen, Rfl: 1 .  TRESIBA FLEXTOUCH 200 UNIT/ML FlexTouch Pen, INJECT 60 UNITS SUBCUTANEOUSLY AT BEDTIME, Disp: 15 mL, Rfl: 0 .  atorvastatin (LIPITOR) 20 MG tablet, Take 1 tablet by mouth daily, Disp: 90 tablet, Rfl: 0 .  dapagliflozin propanediol (FARXIGA) 10 MG TABS tablet, Take 1 tablet (10 mg total) by mouth daily before breakfast., Disp: 90 tablet, Rfl: 1 .  telmisartan (MICARDIS) 20 MG tablet, Take 1 tablet (20 mg total) by mouth daily., Disp: 90 tablet, Rfl: 0   No Known Allergies   Review of Systems  Constitutional: Negative.   Eyes: Negative for blurred vision.  Respiratory: Negative.   Cardiovascular: Negative.  Negative for palpitations.  Gastrointestinal: Negative.   Neurological: Negative.      Today's Vitals   04/19/20 0837  BP:  140/72  Pulse: 95  Temp: 97.7 F (36.5 C)  TempSrc: Oral  Weight: (!) 310 lb 6.4 oz (140.8 kg)  Height: _0  (1.905 m)  PainSc: 0-No pain   Body mass index is 38.8 kg/m.   Objective:  Physical Exam Vitals and nursing note reviewed.  Constitutional:      Appearance: Normal appearance. He is obese.  Cardiovascular:     Rate and Rhythm: Normal rate and regular  rhythm.     Heart sounds: Normal heart sounds.  Pulmonary:     Effort: Pulmonary effort is normal.     Breath sounds: Normal breath sounds.  Skin:    General: Skin is warm.  Neurological:     General: No focal deficit present.     Mental Status: He is alert.  Psychiatric:        Mood and Affect: Mood normal.         Assessment And Plan:     1. Diabetes mellitus type 2 in obese [facility]) Comments: Chronic. Controlled. Will continue with current medication and adjust as needed depending on lab results.  - Hemoglobin A1c - BMP8+EGFR  2. Essential hypertension Comments: Chronic, fair control. Will continue on current medication.  Encouraged to avoid adding salt to his foods.   3. Class 2 severe obesity due to excess calories with serious comorbidity and body mass index (BMI) of 38.0 to 38.9 in adult [facility]) He was congratulated on her 6 pond weight loss. He is encouraged to initially strive for BMI less than 30 to decrease cardiac risk. He is advised to exercise no less than 150 minutes per week.  Wt Readings from Last 3 Encounters:  04/19/20 (!) 310 lb 6.4 oz (140.8 kg)  01/11/20 (!) 316 lb (143.3 kg)  12/24/19 (!) 314 lb (142.4 kg)       Patient was given opportunity to ask questions. Patient verbalized understanding of the plan and was able to repeat key elements of the plan. All questions were answered to their satisfaction.  [name], MD   I, [name], MD, have reviewed all documentation for this visit. The documentation on 04/25/20 for the exam, diagnosis, procedures, and orders are all accurate and complete.  THE PATIENT IS ENCOURAGED TO PRACTICE SOCIAL DISTANCING DUE TO THE COVID-19 PANDEMIC.

## 2020-04-19 NOTE — Patient Instructions (Signed)
Diabetes Mellitus and Exercise Exercising regularly is important for your overall health, especially when you have diabetes (diabetes mellitus). Exercising is not only about losing weight. It has many other health benefits, such as increasing muscle strength and bone density and reducing body fat and stress. This leads to improved fitness, flexibility, and endurance, all of which result in better overall health. Exercise has additional benefits for people with diabetes, including:  Reducing appetite.  Helping to lower and control blood glucose.  Lowering blood pressure.  Helping to control amounts of fatty substances (lipids) in the blood, such as cholesterol and triglycerides.  Helping the body to respond better to insulin (improving insulin sensitivity).  Reducing how much insulin the body needs.  Decreasing the risk for heart disease by: ? Lowering cholesterol and triglyceride levels. ? Increasing the levels of good cholesterol. ? Lowering blood glucose levels. What is my activity plan? Your health care provider or certified diabetes educator can help you make a plan for the type and frequency of exercise (activity plan) that works for you. Make sure that you:  Do at least 150 minutes of moderate-intensity or vigorous-intensity exercise each week. This could be brisk walking, biking, or water aerobics. ? Do stretching and strength exercises, such as yoga or weightlifting, at least 2 times a week. ? Spread out your activity over at least 3 days of the week.  Get some form of physical activity every day. ? Do not go more than 2 days in a row without some kind of physical activity. ? Avoid being inactive for more than 30 minutes at a time. Take frequent breaks to walk or stretch.  Choose a type of exercise or activity that you enjoy, and set realistic goals.  Start slowly, and gradually increase the intensity of your exercise over time. What do I need to know about managing my  diabetes?   Check your blood glucose before and after exercising. ? If your blood glucose is 240 mg/dL (13.3 mmol/L) or higher before you exercise, check your urine for ketones. If you have ketones in your urine, do not exercise until your blood glucose returns to normal. ? If your blood glucose is 100 mg/dL (5.6 mmol/L) or lower, eat a snack containing 15-20 grams of carbohydrate. Check your blood glucose 15 minutes after the snack to make sure that your level is above 100 mg/dL (5.6 mmol/L) before you start your exercise.  Know the symptoms of low blood glucose (hypoglycemia) and how to treat it. Your risk for hypoglycemia increases during and after exercise. Common symptoms of hypoglycemia can include: ? Hunger. ? Anxiety. ? Sweating and feeling clammy. ? Confusion. ? Dizziness or feeling light-headed. ? Increased heart rate or palpitations. ? Blurry vision. ? Tingling or numbness around the mouth, lips, or tongue. ? Tremors or shakes. ? Irritability.  Keep a rapid-acting carbohydrate snack available before, during, and after exercise to help prevent or treat hypoglycemia.  Avoid injecting insulin into areas of the body that are going to be exercised. For example, avoid injecting insulin into: ? The arms, when playing tennis. ? The legs, when jogging.  Keep records of your exercise habits. Doing this can help you and your health care provider adjust your diabetes management plan as needed. Write down: ? Food that you eat before and after you exercise. ? Blood glucose levels before and after you exercise. ? The type and amount of exercise you have done. ? When your insulin is expected to peak, if you use   insulin. Avoid exercising at times when your insulin is peaking.  When you start a new exercise or activity, work with your health care provider to make sure the activity is safe for you, and to adjust your insulin, medicines, or food intake as needed.  Drink plenty of water while  you exercise to prevent dehydration or heat stroke. Drink enough fluid to keep your urine clear or pale yellow. Summary  Exercising regularly is important for your overall health, especially when you have diabetes (diabetes mellitus).  Exercising has many health benefits, such as increasing muscle strength and bone density and reducing body fat and stress.  Your health care provider or certified diabetes educator can help you make a plan for the type and frequency of exercise (activity plan) that works for you.  When you start a new exercise or activity, work with your health care provider to make sure the activity is safe for you, and to adjust your insulin, medicines, or food intake as needed. This information is not intended to replace advice given to you by your health care provider. Make sure you discuss any questions you have with your health care provider. Document Revised: 02/28/2017 Document Reviewed: 01/16/2016 Elsevier Patient Education  2020 [employer] Inc.  

## 2020-04-20 LAB — BMP8+EGFR
BUN/Creatinine Ratio: 15 (ref 10–24)
BUN: 14 mg/dL (ref 8–27)
CO2: 19 mmol/L — ABNORMAL LOW (ref 20–29)
Calcium: 9.5 mg/dL (ref 8.6–10.2)
Chloride: 101 mmol/L (ref 96–106)
Creatinine, Ser: 0.95 mg/dL (ref 0.76–1.27)
GFR calc Af Amer: 99 mL/min/{1.73_m2} (ref >59)
GFR calc non Af Amer: 85 mL/min/{1.73_m2} (ref >59)
Glucose: 184 mg/dL — ABNORMAL HIGH (ref 65–99)
Potassium: 4.7 mmol/L (ref 3.5–5.2)
Sodium: 136 mmol/L (ref 134–144)

## 2020-04-20 LAB — HEMOGLOBIN A1C
Est. average glucose Bld gHb Est-mCnc: 209 mg/dL
Hgb A1c MFr Bld: 8.9 % — ABNORMAL HIGH (ref 4.8–5.6)

## 2020-04-22 ENCOUNTER — Other Ambulatory Visit: Payer: Self-pay

## 2020-04-22 MED ORDER — DAPAGLIFLOZIN PROPANEDIOL 10 MG PO TABS
10.0000 mg | ORAL_TABLET | Freq: Every day | ORAL | 1 refills | Status: DC
Start: 2020-04-22 — End: 2020-07-25

## 2020-04-28 LAB — HM COLONOSCOPY

## 2020-04-29 ENCOUNTER — Other Ambulatory Visit: Payer: Self-pay | Admitting: Internal Medicine

## 2020-04-29 MED ORDER — TRESIBA FLEXTOUCH 200 UNIT/ML ~~LOC~~ SOPN: PEN_INJECTOR | SUBCUTANEOUS | 0 refills | Status: DC

## 2020-05-02 ENCOUNTER — Encounter: Payer: Self-pay | Admitting: Internal Medicine

## 2020-05-16 ENCOUNTER — Encounter: Payer: Self-pay | Admitting: Internal Medicine

## 2020-05-17 ENCOUNTER — Ambulatory Visit: Payer: 59 | Admitting: Internal Medicine

## 2020-05-27 ENCOUNTER — Other Ambulatory Visit: Payer: Self-pay | Admitting: Internal Medicine

## 2020-05-27 MED ORDER — TRESIBA FLEXTOUCH 200 UNIT/ML ~~LOC~~ SOPN: PEN_INJECTOR | SUBCUTANEOUS | 0 refills | Status: DC

## 2020-06-06 ENCOUNTER — Other Ambulatory Visit: Payer: Self-pay

## 2020-06-06 ENCOUNTER — Encounter: Payer: Self-pay | Admitting: Internal Medicine

## 2020-06-06 ENCOUNTER — Ambulatory Visit (INDEPENDENT_AMBULATORY_CARE_PROVIDER_SITE_OTHER): Payer: 59 | Admitting: Internal Medicine

## 2020-06-06 VITALS — BP 160/80 | HR 84 | Temp 97.8°F | Ht 70.8 in | Wt 309.8 lb

## 2020-06-06 DIAGNOSIS — I10 Essential (primary) hypertension: Secondary | ICD-10-CM

## 2020-06-06 DIAGNOSIS — Z2821 Immunization not carried out because of patient refusal: Secondary | ICD-10-CM | POA: Diagnosis not present

## 2020-06-06 DIAGNOSIS — E669 Obesity, unspecified: Secondary | ICD-10-CM | POA: Diagnosis not present

## 2020-06-06 DIAGNOSIS — E1169 Type 2 diabetes mellitus with other specified complication: Secondary | ICD-10-CM

## 2020-06-06 NOTE — Patient Instructions (Signed)

## 2020-06-06 NOTE — Progress Notes (Signed)
I,Tianna Badgett,acting as a Education administrator for [name], MD.,have documented all relevant documentation on the behalf of [name], MD,as directed by  [name], MD while in the presence of [name], MD.  This visit occurred during the SARS-CoV-2 public health emergency.  Safety protocols were in place, including screening questions prior to the visit, additional usage of staff PPE, and extensive cleaning of exam room while observing appropriate contact time as indicated for disinfecting solutions.  Subjective:     Patient ID: Levi Roberts , male    DOB: Feb 05, 1956 , 64 y.o.   MRN: [id number]   Chief Complaint  Patient presents with  . Hypertension    HPI  He presents today for DM/HTN f/u. Patient does not have any concerns at this time. He reports that he stopped his [place] about 3 weeks ago when he found out that insurance would not cover it. He admits that he did not notify the office of the situation. He denies having any issues with the medication.   Hypertension This is a chronic problem. The current episode started more than 1 year ago. The problem has been gradually improving since onset. The problem is controlled. Pertinent negatives include no blurred vision. Risk factors for coronary artery disease include diabetes mellitus, dyslipidemia, obesity and sedentary lifestyle. Past treatments include angiotensin blockers. The current treatment provides moderate improvement. Compliance problems include exercise.   Diabetes He presents for his follow-up diabetic visit. He has type 2 diabetes mellitus. His disease course has been stable. There are no hypoglycemic associated symptoms. Pertinent negatives for diabetes include no blurred vision. There are no hypoglycemic complications. Risk factors for coronary artery disease include diabetes mellitus, dyslipidemia, hypertension, male sex, obesity and sedentary lifestyle. He is compliant with treatment some of the time.  He is following a diabetic diet. He participates in exercise intermittently. His breakfast blood glucose is taken between 8-9 am. His breakfast blood glucose range is generally 130-140 mg/dl. An ACE inhibitor/angiotensin II receptor blocker is being taken.     Past Medical History:  Diagnosis Date  . Diabetes mellitus without complication ([place])   . High cholesterol   . Hypertension      Family History  Problem Relation Age of Onset  . Diabetes Mother   . Heart attack Mother   . Kidney cancer Father   . Diabetes Sister   . Healthy Sister      Current Outpatient Medications:  .  atorvastatin (LIPITOR) 20 MG tablet, Take 1 tablet by mouth daily, Disp: 90 tablet, Rfl: 0 .  finasteride (PROSCAR) 5 MG tablet, Take 1 tablet (5 mg total) by mouth daily., Disp: 90 tablet, Rfl: 3 .  metFORMIN (GLUCOPHAGE) 500 MG tablet, Take 500 mg by mouth in the morning and at bedtime. Take 2 tabs po bid, Disp: , Rfl:  .  Semaglutide, 1 MG/DOSE, (OZEMPIC, 1 MG/DOSE,) 2 MG/1.5ML SOPN, Inject 1 mg into the skin once a week., Disp: 6 pen, Rfl: 1 .  telmisartan (MICARDIS) 20 MG tablet, Take 1 tablet (20 mg total) by mouth daily., Disp: 90 tablet, Rfl: 0 .  TRESIBA FLEXTOUCH 200 UNIT/ML FlexTouch Pen, INJECT 60 UNITS SUBCUTANEOUSLY AT BEDTIME, Disp: 15 mL, Rfl: 0 .  dapagliflozin propanediol (FARXIGA) 10 MG TABS tablet, Take 1 tablet (10 mg total) by mouth daily before breakfast., Disp: 90 tablet, Rfl: 1   No Known Allergies   Review of Systems  Constitutional: Negative.   Eyes: Negative for blurred vision.  Respiratory: Negative.   Cardiovascular: Negative.   Gastrointestinal: Negative.   Neurological: Negative.      Today's Vitals   06/06/20 1628  BP: (!) 160/80  Pulse: 84  Temp: 97.8 F (36.6 C)  TempSrc: Oral  Weight: (!) 309 lb 12.8 oz (140.5 kg)  Height: 5' 10.8" (1.798 m)   Body mass index is 43.45 kg/m.   Objective:  Physical Exam Vitals and nursing note reviewed.  Constitutional:       Appearance: Normal appearance. He is obese.  Cardiovascular:     Rate and Rhythm: Normal rate and regular rhythm.     Heart sounds: Normal heart sounds.  Pulmonary:     Effort: Pulmonary effort is normal.     Breath sounds: Normal breath sounds.  Skin:    General: Skin is warm.  Neurological:     General: No focal deficit present.     Mental Status: He is alert.  Psychiatric:        Mood and Affect: Mood normal.         Assessment And Plan:     1. Diabetes mellitus type 2 in obese [facility]) Comments: Chronic. I will check renal function today. He will resume [place] - he was given samples.  Pt advised that he likely needs PA for insurance approval.  - BMP8+EGFR  2. Essential hypertension Comments: Chronic, uncontrolled. He is encouraged to limit salt intake. He reports he was stuck in trafffic.   3. Influenza vaccination declined     Patient was given opportunity to ask questions. Patient verbalized understanding of the plan and was able to repeat key elements of the plan. All questions were answered to their satisfaction.  [name], MD   I, [name], MD, have reviewed all documentation for this visit. The documentation on 06/11/20 for the exam, diagnosis, procedures, and orders are all accurate and complete.  THE PATIENT IS ENCOURAGED TO PRACTICE SOCIAL DISTANCING DUE TO THE COVID-19 PANDEMIC.

## 2020-06-07 LAB — BMP8+EGFR
BUN/Creatinine Ratio: 18 (ref 10–24)
BUN: 17 mg/dL (ref 8–27)
CO2: 22 mmol/L (ref 20–29)
Calcium: 9.5 mg/dL (ref 8.6–10.2)
Chloride: 106 mmol/L (ref 96–106)
Creatinine, Ser: 0.95 mg/dL (ref 0.76–1.27)
GFR calc Af Amer: 99 mL/min/{1.73_m2} (ref >59)
GFR calc non Af Amer: 85 mL/min/{1.73_m2} (ref >59)
Glucose: 127 mg/dL — ABNORMAL HIGH (ref 65–99)
Potassium: 4.3 mmol/L (ref 3.5–5.2)
Sodium: 140 mmol/L (ref 134–144)

## 2020-07-18 LAB — HM DIABETES EYE EXAM

## 2020-07-20 ENCOUNTER — Ambulatory Visit: Payer: 59 | Admitting: Internal Medicine

## 2020-07-20 ENCOUNTER — Encounter: Payer: Self-pay | Admitting: Internal Medicine

## 2020-07-25 ENCOUNTER — Other Ambulatory Visit: Payer: Self-pay | Admitting: Internal Medicine

## 2020-07-25 ENCOUNTER — Ambulatory Visit (INDEPENDENT_AMBULATORY_CARE_PROVIDER_SITE_OTHER): Payer: 59 | Admitting: Internal Medicine

## 2020-07-25 ENCOUNTER — Other Ambulatory Visit: Payer: Self-pay

## 2020-07-25 VITALS — BP 138/72 | HR 99 | Temp 98.1°F | Ht 72.0 in | Wt 310.6 lb

## 2020-07-25 DIAGNOSIS — E669 Obesity, unspecified: Secondary | ICD-10-CM | POA: Diagnosis not present

## 2020-07-25 DIAGNOSIS — Z6841 Body Mass Index (BMI) 40.0 and over, adult: Secondary | ICD-10-CM

## 2020-07-25 DIAGNOSIS — E1169 Type 2 diabetes mellitus with other specified complication: Secondary | ICD-10-CM | POA: Diagnosis not present

## 2020-07-25 DIAGNOSIS — I10 Essential (primary) hypertension: Secondary | ICD-10-CM

## 2020-07-25 LAB — HEMOGLOBIN A1C
Est. average glucose Bld gHb Est-mCnc: 200 mg/dL
Hgb A1c MFr Bld: 8.6 % — ABNORMAL HIGH (ref 4.8–5.6)

## 2020-07-25 MED ORDER — TRESIBA FLEXTOUCH 200 UNIT/ML ~~LOC~~ SOPN: PEN_INJECTOR | SUBCUTANEOUS | 0 refills | Status: DC

## 2020-07-25 MED ORDER — DAPAGLIFLOZIN PROPANEDIOL 10 MG PO TABS: 10.0000 mg | ORAL_TABLET | Freq: Every day | ORAL | 1 refills | Status: DC

## 2020-07-25 MED ORDER — TELMISARTAN 20 MG PO TABS: 20.0000 mg | ORAL_TABLET | Freq: Every day | ORAL | 2 refills | Status: DC

## 2020-07-25 NOTE — Progress Notes (Signed)
This visit occurred during the SARS-CoV-2 public health emergency.  Safety protocols were in place, including screening questions prior to the visit, additional usage of staff PPE, and extensive cleaning of exam room while observing appropriate contact time as indicated for disinfecting solutions.  Subjective:     Patient ID: Levi Roberts , male    DOB: 08-09-1956 , 64 y.o.   MRN: [id number]   Chief Complaint  Patient presents with  . Hypertension  . Diabetes    HPI  He presents today for DM/HTN f/u. He reports compliance with meds. Admits he is not yet exercising regularly.   Hypertension This is a chronic problem. The current episode started more than 1 year ago. The problem has been gradually improving since onset. The problem is controlled. Pertinent negatives include no blurred vision or headaches. Risk factors for coronary artery disease include diabetes mellitus, dyslipidemia, obesity and sedentary lifestyle. Past treatments include angiotensin blockers. The current treatment provides moderate improvement. Compliance problems include exercise.   Diabetes He presents for his follow-up diabetic visit. He has type 2 diabetes mellitus. His disease course has been stable. There are no hypoglycemic associated symptoms. Pertinent negatives for hypoglycemia include no dizziness or headaches. Pertinent negatives for diabetes include no blurred vision, no fatigue, no polydipsia, no polyphagia and no polyuria. There are no hypoglycemic complications. Risk factors for coronary artery disease include diabetes mellitus, dyslipidemia, hypertension, male sex, obesity and sedentary lifestyle. He is compliant with treatment some of the time. He is following a diabetic diet. He participates in exercise intermittently. His breakfast blood glucose is taken between 8-9 am. His breakfast blood glucose range is generally 130-140 mg/dl. An ACE inhibitor/angiotensin II receptor blocker is being taken.      Past Medical History:  Diagnosis Date  . Diabetes mellitus without complication (HCC)   . High cholesterol   . Hypertension      Family History  Problem Relation Age of Onset  . Diabetes Mother   . Heart attack Mother   . Kidney cancer Father   . Diabetes Sister   . Healthy Sister      Current Outpatient Medications:  .  dapagliflozin propanediol (FARXIGA) 10 MG TABS tablet, Take 1 tablet (10 mg total) by mouth daily before breakfast., Disp: 30 tablet, Rfl: 1 .  finasteride (PROSCAR) 5 MG tablet, Take 1 tablet (5 mg total) by mouth daily., Disp: 90 tablet, Rfl: 3 .  metFORMIN (GLUCOPHAGE) 500 MG tablet, Take 500 mg by mouth in the morning and at bedtime. Take 2 tabs po bid, Disp: , Rfl:  .  Semaglutide, 1 MG/DOSE, (OZEMPIC, 1 MG/DOSE,) 2 MG/1.5ML SOPN, Inject 1 mg into the skin once a week., Disp: 6 pen, Rfl: 1 .  telmisartan (MICARDIS) 20 MG tablet, Take 1 tablet (20 mg total) by mouth daily., Disp: 90 tablet, Rfl: 2 .  atorvastatin (LIPITOR) 20 MG tablet, Take 1 tablet by mouth daily, Disp: 90 tablet, Rfl: 0 .  TRESIBA FLEXTOUCH 200 UNIT/ML FlexTouch Pen, INJECT 60 UNITS SUBCUTANEOUSLY AT BEDTIME, Disp: 15 mL, Rfl: 0   No Known Allergies   Review of Systems  Constitutional: Negative.  Negative for fatigue.  HENT: Negative.   Eyes: Negative for blurred vision.  Endocrine: Negative for polydipsia, polyphagia and polyuria.  Musculoskeletal: Negative.   Skin: Negative.   Neurological: Negative for dizziness and headaches.  Psychiatric/Behavioral: Negative.      Today's Vitals   07/25/20 1457  BP: 138/72  Pulse: 99  Temp: 98.1 F (36.7  C)  TempSrc: Oral  Weight: (!) 310 lb 9.6 oz (140.9 kg)  Height: 6' (1.829 m)   Body mass index is 42.12 kg/m.  Wt Readings from Last 3 Encounters:  07/25/20 (!) 310 lb 9.6 oz (140.9 kg)  06/06/20 (!) 309 lb 12.8 oz (140.5 kg)  04/19/20 (!) 310 lb 6.4 oz (140.8 kg)   Objective:  Physical Exam Vitals and nursing note reviewed.   Constitutional:      Appearance: Normal appearance. He is obese.  Cardiovascular:     Rate and Rhythm: Normal rate and regular rhythm.     Heart sounds: Normal heart sounds.  Pulmonary:     Effort: Pulmonary effort is normal.     Breath sounds: Normal breath sounds.  Skin:    General: Skin is warm.  Neurological:     General: No focal deficit present.     Mental Status: He is alert.  Psychiatric:        Mood and Affect: Mood normal.         Assessment And Plan:     1. Essential hypertension Comments: Chronic, fair control. He will continue with current meds. Encouraged to avoid adding salt to his foods.   2. Diabetes mellitus type 2 in obese [facility]) Comments: I plan to change Farxiga/metformin to Xigduo. He is advised to let me know when he is about to run out of metformin. I will adjust meds as needed.  - Hemoglobin A1c  3. Class 3 severe obesity due to excess calories with serious comorbidity and body mass index (BMI) of 40.0 to 44.9 in adult [facility] Comments: Chronic. He is encouraged to incorporate at least 150 minutes of exercise per week.   Patient was given opportunity to ask questions. Patient verbalized understanding of the plan and was able to repeat key elements of the plan. All questions were answered to their satisfaction.  [name], MD   I, [name], MD, have reviewed all documentation for this visit. The documentation on 08/16/20 for the exam, diagnosis, procedures, and orders are all accurate and complete.  THE PATIENT IS ENCOURAGED TO PRACTICE SOCIAL DISTANCING DUE TO THE COVID-19 PANDEMIC.

## 2020-07-25 NOTE — Patient Instructions (Signed)

## 2020-07-27 ENCOUNTER — Other Ambulatory Visit: Payer: Self-pay | Admitting: Internal Medicine

## 2020-07-27 DIAGNOSIS — Z79899 Other long term (current) drug therapy: Secondary | ICD-10-CM

## 2020-07-28 LAB — BMP8+EGFR
BUN/Creatinine Ratio: 16 (ref 10–24)
BUN: 16 mg/dL (ref 8–27)
CO2: 21 mmol/L (ref 20–29)
Calcium: 9 mg/dL (ref 8.6–10.2)
Chloride: 102 mmol/L (ref 96–106)
Creatinine, Ser: 1.01 mg/dL (ref 0.76–1.27)
GFR calc Af Amer: 91 mL/min/{1.73_m2} (ref >59)
GFR calc non Af Amer: 79 mL/min/{1.73_m2} (ref >59)
Glucose: 168 mg/dL — ABNORMAL HIGH (ref 65–99)
Potassium: 4.4 mmol/L (ref 3.5–5.2)
Sodium: 137 mmol/L (ref 134–144)

## 2020-07-29 ENCOUNTER — Telehealth: Payer: Self-pay

## 2020-07-29 NOTE — Telephone Encounter (Signed)
Left the patient a message to call back for lab results. 

## 2020-07-29 NOTE — Telephone Encounter (Signed)
-----   Message from Dorothyann Peng, MD sent at 07/29/2020 11:12 AM EST ----- Your hba1c has improved slightly - down to 8.6 from 8.9.  Our goal is less than 7.5. Are you taking the insulin nightly? Please cut out sugary drinks.

## 2020-08-02 ENCOUNTER — Other Ambulatory Visit: Payer: Self-pay | Admitting: Internal Medicine

## 2020-08-02 MED ORDER — ATORVASTATIN CALCIUM 20 MG PO TABS: ORAL_TABLET | ORAL | 0 refills | Status: DC

## 2020-08-06 ENCOUNTER — Ambulatory Visit: Payer: 59 | Attending: Internal Medicine

## 2020-08-06 DIAGNOSIS — Z23 Encounter for immunization: Secondary | ICD-10-CM

## 2020-08-06 NOTE — Progress Notes (Signed)
   Covid-19 Vaccination Clinic  Name:  Levi Roberts    MRN: [id number] DOB: Dec 11, 1955  08/06/2020  [name] was observed post Covid-19 immunization for 15 minutes without incident. He was provided with Vaccine Information Sheet and instruction to access the V-Safe system.   [name] was instructed to call 911 with any severe reactions post vaccine: [name] Kitchen Difficulty breathing  . Swelling of face and throat  . A fast heartbeat  . A bad rash all over body  . Dizziness and weakness   Immunizations Administered    Name Date Dose VIS Date Route   Pfizer COVID-19 Vaccine 08/06/2020 12:17 PM 0.3 mL 06/08/2020 Intramuscular   Manufacturer: [employer], [employer]   Lot: [id number]   NDC: [address]-9

## 2020-08-16 ENCOUNTER — Encounter: Payer: Self-pay | Admitting: Internal Medicine

## 2020-08-22 ENCOUNTER — Other Ambulatory Visit: Payer: Self-pay | Admitting: Internal Medicine

## 2020-08-22 MED ORDER — TRESIBA FLEXTOUCH 200 UNIT/ML ~~LOC~~ SOPN: PEN_INJECTOR | SUBCUTANEOUS | 0 refills | Status: DC

## 2020-09-21 ENCOUNTER — Other Ambulatory Visit: Payer: Self-pay | Admitting: Internal Medicine

## 2020-09-21 MED ORDER — TRESIBA FLEXTOUCH 200 UNIT/ML ~~LOC~~ SOPN: PEN_INJECTOR | SUBCUTANEOUS | 0 refills | Status: DC

## 2020-10-19 ENCOUNTER — Other Ambulatory Visit: Payer: Self-pay

## 2020-10-19 ENCOUNTER — Other Ambulatory Visit: Payer: Self-pay | Admitting: Internal Medicine

## 2020-10-19 MED ORDER — OZEMPIC (1 MG/DOSE) 2 MG/1.5ML ~~LOC~~ SOPN: 1.0000 mg | PEN_INJECTOR | SUBCUTANEOUS | 2 refills | Status: DC

## 2020-10-19 MED ORDER — TRESIBA FLEXTOUCH 200 UNIT/ML ~~LOC~~ SOPN: PEN_INJECTOR | SUBCUTANEOUS | 2 refills | Status: DC

## 2020-10-24 ENCOUNTER — Ambulatory Visit (INDEPENDENT_AMBULATORY_CARE_PROVIDER_SITE_OTHER): Payer: 59 | Admitting: Nurse Practitioner

## 2020-10-24 ENCOUNTER — Encounter: Payer: Self-pay | Admitting: Nurse Practitioner

## 2020-10-24 ENCOUNTER — Other Ambulatory Visit: Payer: Self-pay

## 2020-10-24 VITALS — BP 134/76 | HR 105 | Temp 98.6°F | Ht 72.0 in | Wt 314.2 lb

## 2020-10-24 DIAGNOSIS — E1169 Type 2 diabetes mellitus with other specified complication: Secondary | ICD-10-CM

## 2020-10-24 DIAGNOSIS — I10 Essential (primary) hypertension: Secondary | ICD-10-CM

## 2020-10-24 DIAGNOSIS — E669 Obesity, unspecified: Secondary | ICD-10-CM | POA: Diagnosis not present

## 2020-10-24 MED ORDER — SYNJARDY 5-500 MG PO TABS
1.0000 | ORAL_TABLET | Freq: Two times a day (BID) | ORAL | 2 refills | Status: DC
Start: 2020-10-24 — End: 2021-05-08

## 2020-10-24 NOTE — Progress Notes (Signed)
I,Yamilka Roman [employer] as a Neurosurgeon for [employer], FNP.,have documented all relevant documentation on the behalf of Arnette Felts, FNP,as directed by  Arnette Felts, FNP while in the presence of Arnette Felts, FNP. This visit occurred during the SARS-CoV-2 public health emergency.  Safety protocols were in place, including screening questions prior to the visit, additional usage of staff PPE, and extensive cleaning of exam room while observing appropriate contact time as indicated for disinfecting solutions.  Subjective:     Patient ID: Levi Roberts , male    DOB: August 13, 1956 , 65 y.o.   MRN: [id number]   Chief Complaint  Patient presents with  . Diabetes  . Hypertension    HPI  He presents today for DM/HTN f/u. He reports compliance with meds. He is not taking his farxiga due to the cost.    Wt Readings from Last 3 Encounters: 10/24/20 : (!) 314 lb 3.2 oz (142.5 kg) 07/25/20 : (!) 310 lb 9.6 oz (140.9 kg) 06/06/20 : (!) 309 lb 12.8 oz (140.5 kg)  Diabetes He presents for his follow-up diabetic visit. He has type 2 diabetes mellitus. His disease course has been stable. There are no hypoglycemic associated symptoms. Pertinent negatives for hypoglycemia include no dizziness or headaches. Pertinent negatives for diabetes include no blurred vision, no fatigue, no polydipsia, no polyphagia and no polyuria. There are no hypoglycemic complications. Risk factors for coronary artery disease include diabetes mellitus, dyslipidemia, hypertension, male sex, obesity and sedentary lifestyle. He is compliant with treatment some of the time. He is following a diabetic diet. He participates in exercise intermittently. His breakfast blood glucose is taken between 8-9 am. His breakfast blood glucose range is generally 130-140 mg/dl. An ACE inhibitor/angiotensin II receptor blocker is being taken.  Hypertension This is a chronic problem. The current episode started more than 1 year ago. The  problem has been gradually improving since onset. The problem is controlled. Pertinent negatives include no blurred vision or headaches. Risk factors for coronary artery disease include diabetes mellitus, dyslipidemia, obesity and sedentary lifestyle. Past treatments include angiotensin blockers. The current treatment provides moderate improvement. Compliance problems include exercise.      Past Medical History:  Diagnosis Date  . Diabetes mellitus without complication (HCC)   . High cholesterol   . Hypertension      Family History  Problem Relation Age of Onset  . Diabetes Mother   . Heart attack Mother   . Kidney cancer Father   . Diabetes Sister   . Healthy Sister      Current Outpatient Medications:  .  atorvastatin (LIPITOR) 20 MG tablet, Take 1 tablet by mouth daily, Disp: 90 tablet, Rfl: 0 .  dapagliflozin propanediol (FARXIGA) 10 MG TABS tablet, Take 1 tablet (10 mg total) by mouth daily before breakfast., Disp: 30 tablet, Rfl: 1 .  finasteride (PROSCAR) 5 MG tablet, Take 1 tablet (5 mg total) by mouth daily., Disp: 90 tablet, Rfl: 3 .  metFORMIN (GLUCOPHAGE) 500 MG tablet, Take 500 mg by mouth in the morning and at bedtime. Take 2 tabs po bid, Disp: , Rfl:  .  Semaglutide, 1 MG/DOSE, (OZEMPIC, 1 MG/DOSE,) 4 MG/3ML SOPN, Inject 1 mg into the skin once a week., Disp: 9 mL, Rfl: 3 .  telmisartan (MICARDIS) 20 MG tablet, Take 1 tablet (20 mg total) by mouth daily., Disp: 90 tablet, Rfl: 2 .  TRESIBA FLEXTOUCH 200 UNIT/ML FlexTouch Pen, INJECT 60 UNITS SUBCUTANEOUSLY AT BEDTIME, Disp: 15 mL, Rfl: 2  No Known Allergies   Review of Systems  Constitutional: Negative for fatigue.  Eyes: Negative for blurred vision.  Respiratory: Negative.   Cardiovascular: Negative.   Endocrine: Negative for polydipsia, polyphagia and polyuria.  Neurological: Negative for dizziness and headaches.  Psychiatric/Behavioral: Negative.      Today's Vitals   10/24/20 1622  BP: 134/76  Pulse:  (!) 105  Temp: 98.6 F (37 C)  TempSrc: Oral  Weight: (!) 314 lb 3.2 oz (142.5 kg)  Height: 6' (1.829 m)  PainSc: 0-No pain   Body mass index is 42.61 kg/m.   Objective:  Physical Exam Constitutional:      Appearance: Normal appearance. He is obese.  Cardiovascular:     Rate and Rhythm: Normal rate.  Pulmonary:     Effort: Pulmonary effort is normal.     Breath sounds: Normal breath sounds.  Abdominal:     General: Abdomen is flat. Bowel sounds are normal.  Musculoskeletal:        General: Normal range of motion.     Cervical back: Normal range of motion and neck supple.  Skin:    General: Skin is warm and dry.     Capillary Refill: Capillary refill takes less than 2 seconds.  Neurological:     General: No focal deficit present.     Mental Status: He is alert and oriented to person, place, and time.  Psychiatric:        Mood and Affect: Mood normal.        Behavior: Behavior normal.        Thought Content: Thought content normal.        Judgment: Judgment normal.         Assessment And Plan:     1. Essential hypertension  Chronic, fairly controlled  Continue with current medications  2. Diabetes mellitus type 2 in obese (HCC)  Chronic, he is not taking the xigduo will change to synjardy  Once he is out of his metformin he is to start the synjdardy  Will check HgbA1c  He continues with ozempic and tresiba    Patient was given opportunity to ask questions. Patient verbalized understanding of the plan and was able to repeat key elements of the plan. All questions were answered to their satisfaction.  Arnette Felts, FNP   I, Arnette Felts, FNP, have reviewed all documentation for this visit. The documentation on 10/24/20 for the exam, diagnosis, procedures, and orders are all accurate and complete.   THE PATIENT IS ENCOURAGED TO PRACTICE SOCIAL DISTANCING DUE TO THE COVID-19 PANDEMIC.

## 2020-10-24 NOTE — Patient Instructions (Signed)

## 2020-10-25 ENCOUNTER — Ambulatory Visit: Payer: 59 | Admitting: Internal Medicine

## 2020-10-25 LAB — CMP14+EGFR
ALT: 14 IU/L (ref 0–44)
AST: 14 IU/L (ref 0–40)
Albumin/Globulin Ratio: 1.2 (ref 1.2–2.2)
Albumin: 4.1 g/dL (ref 3.8–4.8)
Alkaline Phosphatase: 74 IU/L (ref 44–121)
BUN/Creatinine Ratio: 13 (ref 10–24)
BUN: 14 mg/dL (ref 8–27)
Bilirubin Total: 0.4 mg/dL (ref 0.0–1.2)
CO2: 22 mmol/L (ref 20–29)
Calcium: 9.2 mg/dL (ref 8.6–10.2)
Chloride: 106 mmol/L (ref 96–106)
Creatinine, Ser: 1.08 mg/dL (ref 0.76–1.27)
Globulin, Total: 3.3 g/dL (ref 1.5–4.5)
Glucose: 161 mg/dL — ABNORMAL HIGH (ref 65–99)
Potassium: 5.1 mmol/L (ref 3.5–5.2)
Sodium: 143 mmol/L (ref 134–144)
Total Protein: 7.4 g/dL (ref 6.0–8.5)
eGFR: 77 mL/min/{1.73_m2} (ref >59)

## 2020-10-25 LAB — HEMOGLOBIN A1C
Est. average glucose Bld gHb Est-mCnc: 214 mg/dL
Hgb A1c MFr Bld: 9.1 % — ABNORMAL HIGH (ref 4.8–5.6)

## 2020-11-18 ENCOUNTER — Other Ambulatory Visit: Payer: Self-pay | Admitting: Nurse Practitioner

## 2020-11-21 MED ORDER — ATORVASTATIN CALCIUM 20 MG PO TABS: ORAL_TABLET | ORAL | 0 refills | Status: DC

## 2020-12-26 ENCOUNTER — Ambulatory Visit: Payer: Self-pay | Admitting: Urology

## 2021-01-05 ENCOUNTER — Ambulatory Visit: Payer: Self-pay | Admitting: Urology

## 2021-01-24 ENCOUNTER — Other Ambulatory Visit: Payer: Self-pay

## 2021-01-24 ENCOUNTER — Other Ambulatory Visit: Payer: Self-pay | Admitting: Nurse Practitioner

## 2021-01-24 ENCOUNTER — Encounter: Payer: Self-pay | Admitting: Internal Medicine

## 2021-01-24 MED ORDER — TRESIBA FLEXTOUCH 200 UNIT/ML ~~LOC~~ SOPN: PEN_INJECTOR | SUBCUTANEOUS | 2 refills | Status: DC

## 2021-01-24 MED ORDER — ATORVASTATIN CALCIUM 20 MG PO TABS: ORAL_TABLET | ORAL | 0 refills | Status: DC

## 2021-01-30 ENCOUNTER — Ambulatory Visit (INDEPENDENT_AMBULATORY_CARE_PROVIDER_SITE_OTHER): Payer: 59 | Admitting: Urology

## 2021-01-30 ENCOUNTER — Ambulatory Visit (INDEPENDENT_AMBULATORY_CARE_PROVIDER_SITE_OTHER): Payer: 59 | Admitting: Internal Medicine

## 2021-01-30 ENCOUNTER — Encounter: Payer: Self-pay | Admitting: Internal Medicine

## 2021-01-30 ENCOUNTER — Other Ambulatory Visit: Payer: Self-pay

## 2021-01-30 ENCOUNTER — Encounter: Payer: Self-pay | Admitting: Urology

## 2021-01-30 VITALS — BP 134/78 | HR 75 | Ht 72.0 in | Wt 308.0 lb

## 2021-01-30 VITALS — BP 122/70 | HR 93 | Temp 98.3°F | Ht 72.0 in | Wt 312.2 lb

## 2021-01-30 DIAGNOSIS — I10 Essential (primary) hypertension: Secondary | ICD-10-CM | POA: Diagnosis not present

## 2021-01-30 DIAGNOSIS — Z23 Encounter for immunization: Secondary | ICD-10-CM

## 2021-01-30 DIAGNOSIS — E7800 Pure hypercholesterolemia, unspecified: Secondary | ICD-10-CM

## 2021-01-30 DIAGNOSIS — N5201 Erectile dysfunction due to arterial insufficiency: Secondary | ICD-10-CM | POA: Diagnosis not present

## 2021-01-30 DIAGNOSIS — R972 Elevated prostate specific antigen [PSA]: Secondary | ICD-10-CM | POA: Insufficient documentation

## 2021-01-30 DIAGNOSIS — Z6841 Body Mass Index (BMI) 40.0 and over, adult: Secondary | ICD-10-CM

## 2021-01-30 DIAGNOSIS — R609 Edema, unspecified: Secondary | ICD-10-CM

## 2021-01-30 DIAGNOSIS — E1169 Type 2 diabetes mellitus with other specified complication: Secondary | ICD-10-CM

## 2021-01-30 DIAGNOSIS — E669 Obesity, unspecified: Secondary | ICD-10-CM

## 2021-01-30 DIAGNOSIS — N401 Enlarged prostate with lower urinary tract symptoms: Secondary | ICD-10-CM | POA: Insufficient documentation

## 2021-01-30 MED ORDER — SHINGRIX 50 MCG/0.5ML IM SUSR: 0.5000 mL | Freq: Once | INTRAMUSCULAR | 0 refills | Status: AC

## 2021-01-30 MED ORDER — TADALAFIL 20 MG PO TABS
20.0000 mg | ORAL_TABLET | Freq: Every day | ORAL | 0 refills | Status: DC | PRN
Start: 2021-01-30 — End: 2022-02-07

## 2021-01-30 MED ORDER — ATORVASTATIN CALCIUM 20 MG PO TABS: ORAL_TABLET | ORAL | 2 refills | Status: DC

## 2021-01-30 MED ORDER — FINASTERIDE 5 MG PO TABS: 5.0000 mg | ORAL_TABLET | Freq: Every day | ORAL | 3 refills | Status: DC

## 2021-01-30 NOTE — Progress Notes (Signed)
I,Katawbba Wiggins,acting as a Education administrator for [name], MD.,have documented all relevant documentation on the behalf of [name], MD,as directed by  [name], MD while in the presence of [name], MD.  This visit occurred during the SARS-CoV-2 public health emergency.  Safety protocols were in place, including screening questions prior to the visit, additional usage of staff PPE, and extensive cleaning of exam room while observing appropriate contact time as indicated for disinfecting solutions.  Subjective:     Patient ID: KRIS NO , male    DOB: 07-Jan-1956 , 65 y.o.   MRN: [id number]   Chief Complaint  Patient presents with   Diabetes   Hypertension   Hyperlipidemia     HPI  He presents today for DM/HTN f/u. He reports he has made some dietary changes to help improve his blood sugars. He states he has noticed an improvement in his sugars. He was prescribed BID dosing of Synjardy.   Of note, he adds he had a follow-up with his urologist earlier today.  Diabetes He presents for his follow-up diabetic visit. He has type 2 diabetes mellitus. His disease course has been stable. There are no hypoglycemic associated symptoms. Pertinent negatives for hypoglycemia include no dizziness or headaches. Pertinent negatives for diabetes include no blurred vision, no fatigue, no polydipsia, no polyphagia and no polyuria. There are no hypoglycemic complications. Risk factors for coronary artery disease include diabetes mellitus, dyslipidemia, hypertension, male sex, obesity and sedentary lifestyle. He is compliant with treatment some of the time. He is following a diabetic diet. He participates in exercise intermittently. His breakfast blood glucose is taken between 8-9 am. His breakfast blood glucose range is generally 130-140 mg/dl. An ACE inhibitor/angiotensin II receptor blocker is being taken.  Hypertension This is a chronic problem. The current episode started more  than 1 year ago. The problem has been gradually improving since onset. The problem is controlled. Pertinent negatives include no blurred vision or headaches. Risk factors for coronary artery disease include diabetes mellitus, dyslipidemia, obesity and sedentary lifestyle. Past treatments include angiotensin blockers. The current treatment provides moderate improvement. Compliance problems include exercise.   Hyperlipidemia This is a chronic problem. The current episode started more than 1 year ago. Exacerbating diseases include diabetes and obesity. Current antihyperlipidemic treatment includes statins. Risk factors for coronary artery disease include diabetes mellitus, dyslipidemia, hypertension and male sex.    Past Medical History:  Diagnosis Date   Diabetes mellitus without complication (HCC)    High cholesterol    Hypertension      Family History  Problem Relation Age of Onset   Diabetes Mother    Heart attack Mother    Kidney cancer Father    Diabetes Sister    Healthy Sister      Current Outpatient Medications:    Empagliflozin-metFORMIN HCl (SYNJARDY) 5-500 MG TABS, Take 1 tablet by mouth 2 (two) times daily. (Patient taking differently: Take 1 tablet by mouth 2 (two) times daily. 1 time per day), Disp: 60 tablet, Rfl: 2   finasteride (PROSCAR) 5 MG tablet, Take 1 tablet (5 mg total) by mouth daily., Disp: 90 tablet, Rfl: 3   Semaglutide, 1 MG/DOSE, (OZEMPIC, 1 MG/DOSE,) 4 MG/3ML SOPN, Inject 1 mg into the skin once a week., Disp: 9 mL, Rfl: 3   tadalafil (CIALIS) 20 MG tablet, Take 1 tablet (20 mg total) by mouth daily as needed for erectile dysfunction., Disp: 10 tablet, Rfl: 0   telmisartan (MICARDIS) 20  MG tablet, Take 1 tablet (20 mg total) by mouth daily., Disp: 90 tablet, Rfl: 2   TRESIBA FLEXTOUCH 200 UNIT/ML FlexTouch Pen, INJECT 60 UNITS SUBCUTANEOUSLY AT BEDTIME, Disp: 15 mL, Rfl: 2   Zoster Vaccine Adjuvanted (SHINGRIX) injection, Inject 0.5 mLs into the muscle once  for 1 dose., Disp: 0.5 mL, Rfl: 0   atorvastatin (LIPITOR) 20 MG tablet, Take 1 tablet by mouth daily, Disp: 90 tablet, Rfl: 2   No Known Allergies   Review of Systems  Constitutional: Negative.  Negative for fatigue.  Eyes:  Negative for blurred vision.  Respiratory: Negative.    Cardiovascular: Negative.   Gastrointestinal: Negative.   Endocrine: Negative for polydipsia, polyphagia and polyuria.  Neurological:  Negative for dizziness and headaches.  Psychiatric/Behavioral: Negative.    All other systems reviewed and are negative.   Today's Vitals   01/30/21 1434  BP: 122/70  Pulse: 93  Temp: 98.3 F (36.8 C)  TempSrc: Oral  Weight: (!) 312 lb 3.2 oz (141.6 kg)  Height: 6' (1.829 m)   Body mass index is 42.34 kg/m.  Wt Readings from Last 3 Encounters:  01/30/21 (!) 312 lb 3.2 oz (141.6 kg)  01/30/21 (!) 308 lb (139.7 kg)  10/24/20 (!) 314 lb 3.2 oz (142.5 kg)    BP Readings from Last 3 Encounters:  01/30/21 122/70  01/30/21 134/78  10/24/20 134/76    Objective:  Physical Exam Vitals and nursing note reviewed.  Constitutional:      Appearance: Normal appearance. He is obese.  HENT:     Head: Normocephalic and atraumatic.     Nose:     Comments: Masked     Mouth/Throat:     Comments: Masked  Cardiovascular:     Rate and Rhythm: Normal rate and regular rhythm.     Heart sounds: Normal heart sounds.  Pulmonary:     Effort: Pulmonary effort is normal.     Breath sounds: Normal breath sounds.  Musculoskeletal:     Right lower leg: Edema present.     Left lower leg: Edema present.  Skin:    General: Skin is warm.  Neurological:     General: No focal deficit present.     Mental Status: He is alert.  Psychiatric:        Mood and Affect: Mood normal.        Assessment And Plan:     1. Diabetes mellitus type 2 in obese [facility]) Comments: Chronic, I will check labs as listed below. He was congratulated on his lifestyle changes. I will adjust meds as needed.   - Hemoglobin A1c - BMP8+EGFR  2. Essential hypertension Comments: Chronic, well controlled.  He is encouraged to follow low sodium diet. I will not make any medication changes today.   3. Pure hypercholesterolemia Comments: Chronic, importance of compliance with statin therapy was d/w pt.  Refill of atorvastatin will be sent to pharmacy.  - Lipid panel  4. 1+ pitting edema Comments: Rx written for compression hose. Advised to elevate legs when seated and decrease salt intake.   5. Class 3 severe obesity due to excess calories with serious comorbidity and body mass index (BMI) of 40.0 to 44.9 in adult [facility]) Comments: BMI 42. He is encouraged to strive for BMI less than 35 to decrease cardiac risk. Advised to aim for at least 150 minutes per week.   6. Immunization due Comments: I will send rx Shingrix to his local pharmacy. He was given pneumovax-23 IM x  1 today.    Patient was given opportunity to ask questions. Patient verbalized understanding of the plan and was able to repeat key elements of the plan. All questions were answered to their satisfaction.   I, [name], MD, have reviewed all documentation for this visit. The documentation on 01/30/21 for the exam, diagnosis, procedures, and orders are all accurate and complete.   IF YOU HAVE BEEN REFERRED TO A SPECIALIST, IT MAY TAKE 1-2 WEEKS TO SCHEDULE/PROCESS THE REFERRAL. IF YOU HAVE NOT HEARD FROM US/SPECIALIST IN TWO WEEKS, PLEASE GIVE [place] A CALL AT [phone] X [phone].   THE PATIENT IS ENCOURAGED TO PRACTICE SOCIAL DISTANCING DUE TO THE COVID-19 PANDEMIC.

## 2021-01-30 NOTE — Progress Notes (Signed)
01/30/2021 8:53 AM   Levi Roberts 17-Sep-1955 [id number]  Referring provider: Dorothyann Peng, MD [address] [place],  [place] [address]  Chief Complaint  Patient presents with   Follow-up    Urologic history: BPH with LUTS Previously on finasteride which was restarted May 2021  2.  Elevated PSA 2 prior negative biopsies; Dr. Vernie Ammons at [facility]  3.  Urethral bleeding Cystoscopy 12/2019 with BPH  4.  Erectile dysfunction On sildenafil   HPI: 65 y.o. male presents for annual follow-up.  No bothersome LUTS since last years visit Denies recurrent urethral bleeding, hematuria or dysuria No flank, abdominal or pelvic pain Has noted decreased efficacy with sildenafil.  Has previously been on intracavernosal injections and vacuum erection devices   PMH: Past Medical History:  Diagnosis Date   Diabetes mellitus without complication (HCC)    High cholesterol    Hypertension     Surgical History: History reviewed. No pertinent surgical history.  Home Medications:  Allergies as of 01/30/2021   No Known Allergies      Medication List        Accurate as of January 30, 2021  8:53 AM. If you have any questions, ask your nurse or doctor.          atorvastatin 20 MG tablet Commonly known as: LIPITOR Take 1 tablet by mouth daily   atorvastatin 20 MG tablet Commonly known as: LIPITOR Take 1 tablet by mouth once daily   finasteride 5 MG tablet Commonly known as: PROSCAR Take 1 tablet (5 mg total) by mouth daily.   metFORMIN 500 MG tablet Commonly known as: GLUCOPHAGE Take 500 mg by mouth in the morning and at bedtime. Take 2 tabs po bid   Ozempic (1 MG/DOSE) 4 MG/3ML Sopn Generic drug: Semaglutide (1 MG/DOSE) Inject 1 mg into the skin once a week.   Synjardy 5-500 MG Tabs Generic drug: Empagliflozin-metFORMIN HCl Take 1 tablet by mouth 2 (two) times daily.   telmisartan 20 MG tablet Commonly known as: MICARDIS Take 1 tablet (20 mg  total) by mouth daily.   [name] FlexTouch 200 UNIT/ML FlexTouch Pen Generic drug: insulin degludec INJECT 60 UNITS SUBCUTANEOUSLY AT BEDTIME        Allergies: No Known Allergies  Family History: Family History  Problem Relation Age of Onset   Diabetes Mother    Heart attack Mother    Kidney cancer Father    Diabetes Sister    Healthy Sister     Social History:  reports that he has never smoked. He has never used smokeless tobacco. He reports that he does not drink alcohol and does not use drugs.   Physical Exam: BP 134/78   Pulse 75   Ht 6' (1.829 m)   Wt (!) 308 lb (139.7 kg)   BMI 41.77 kg/m   Constitutional:  Alert and oriented, No acute distress. HEENT: [place] AT, moist mucus membranes.  Trachea midline, no masses. Cardiovascular: No clubbing, cyanosis, or edema. Respiratory: Normal respiratory effort, no increased work of breathing. GI: Abdomen is soft, nontender, nondistended, no abdominal masses GU: Prostate only lower one half palpable, no nodules or induration; estimated size greater than sign 30 g Skin: No rashes, bruises or suspicious lesions. Neurologic: Grossly intact, no focal deficits, moving all 4 extremities. Psychiatric: Normal mood and affect.   Assessment & Plan:    1.  BPH with LUTS Stable Finasteride refilled Continue annual follow-up  2.  Elevated PSA PSA drawn today and he will  be notified with results  3.  Erectile dysfunction Worsening Has also tried intracavernosal injections and vacuum erection devices He was given literature on penile prosthesis which we discussed briefly.  If interested will refer to [facility] He did request a trial of generic tadalafil and Rx was sent to pharmacy   Riki Altes, MD  [facility] [address] [place], [place] [address] [phone]

## 2021-01-30 NOTE — Patient Instructions (Signed)

## 2021-01-31 LAB — BMP8+EGFR
BUN/Creatinine Ratio: 15 (ref 10–24)
BUN: 16 mg/dL (ref 8–27)
CO2: 20 mmol/L (ref 20–29)
Calcium: 9 mg/dL (ref 8.6–10.2)
Chloride: 103 mmol/L (ref 96–106)
Creatinine, Ser: 1.08 mg/dL (ref 0.76–1.27)
Glucose: 176 mg/dL — ABNORMAL HIGH (ref 65–99)
Potassium: 4.7 mmol/L (ref 3.5–5.2)
Sodium: 139 mmol/L (ref 134–144)
eGFR: 77 mL/min/{1.73_m2} (ref >59)

## 2021-01-31 LAB — LIPID PANEL
Chol/HDL Ratio: 3.8 ratio (ref 0.0–5.0)
Cholesterol, Total: 118 mg/dL (ref 100–199)
HDL: 31 mg/dL — ABNORMAL LOW (ref >39)
LDL Chol Calc (NIH): 61 mg/dL (ref 0–99)
Triglycerides: 150 mg/dL — ABNORMAL HIGH (ref 0–149)
VLDL Cholesterol Cal: 26 mg/dL (ref 5–40)

## 2021-01-31 LAB — HEMOGLOBIN A1C
Est. average glucose Bld gHb Est-mCnc: 223 mg/dL
Hgb A1c MFr Bld: 9.4 % — ABNORMAL HIGH (ref 4.8–5.6)

## 2021-01-31 LAB — PSA: Prostate Specific Ag, Serum: 3.7 ng/mL (ref 0.0–4.0)

## 2021-02-02 ENCOUNTER — Telehealth: Payer: Self-pay

## 2021-02-02 NOTE — Telephone Encounter (Signed)
I left the patient a message to call the office back with the responses to the questions that were with his lab results or he can respond in mychart.

## 2021-02-02 NOTE — Telephone Encounter (Signed)
-----   Message from Dorothyann Peng, MD sent at 02/01/2021 10:49 PM EDT ----- Your hba1c is 9.4, this is higher than last visit. I would like to increase the dose of Synjardy. Are you willing to take higher dose? Have you been consistent with taking Tresiba 60 units every night?

## 2021-05-07 NOTE — Patient Instructions (Addendum)
Health Maintenance, Male Adopting a healthy lifestyle and getting preventive care are important in promoting health and wellness. Ask your health care provider about: The right schedule for you to have regular tests and exams. Things you can do on your own to prevent diseases and keep yourself healthy. What should I know about diet, weight, and exercise? Eat a healthy diet  Eat a diet that includes plenty of vegetables, fruits, low-fat dairy products, and lean protein. Do not eat a lot of foods that are high in solid fats, added sugars, or sodium. Maintain a healthy weight Body mass index (BMI) is a measurement that can be used to identify possible weight problems. It estimates body fat based on height and weight. Your health care provider can help determine your BMI and help you achieve or maintain a healthy weight. Get regular exercise Get regular exercise. This is one of the most important things you can do for your health. Most adults should: Exercise for at least 150 minutes each week. The exercise should increase your heart rate and make you sweat (moderate-intensity exercise). Do strengthening exercises at least twice a week. This is in addition to the moderate-intensity exercise. Spend less time sitting. Even light physical activity can be beneficial. Watch cholesterol and blood lipids Have your blood tested for lipids and cholesterol at 64 years of age, then have this test every 5 years. You may need to have your cholesterol levels checked more often if: Your lipid or cholesterol levels are high. You are older than 64 years of age. You are at high risk for heart disease. What should I know about cancer screening? Many types of cancers can be detected early and may often be prevented. Depending on your health history and family history, you may need to have cancer screening at various ages. This may include screening for: Colorectal cancer. Prostate cancer. Skin cancer. Lung  cancer. What should I know about heart disease, diabetes, and high blood pressure? Blood pressure and heart disease High blood pressure causes heart disease and increases the risk of stroke. This is more likely to develop in people who have high blood pressure readings, are of African descent, or are overweight. Talk with your health care provider about your target blood pressure readings. Have your blood pressure checked: Every 3-5 years if you are 18-39 years of age. Every year if you are 40 years old or older. If you are between the ages of 65 and 75 and are a current or former smoker, ask your health care provider if you should have a one-time screening for abdominal aortic aneurysm (AAA). Diabetes Have regular diabetes screenings. This checks your fasting blood sugar level. Have the screening done: Once every three years after age 45 if you are at a normal weight and have a low risk for diabetes. More often and at a younger age if you are overweight or have a high risk for diabetes. What should I know about preventing infection? Hepatitis B If you have a higher risk for hepatitis B, you should be screened for this virus. Talk with your health care provider to find out if you are at risk for hepatitis B infection. Hepatitis C Blood testing is recommended for: Everyone born from 1945 through 1965. Anyone with known risk factors for hepatitis C. Sexually transmitted infections (STIs) You should be screened each year for STIs, including gonorrhea and chlamydia, if: You are sexually active and are younger than 64 years of age. You are older than 64 years   of age and your health care provider tells you that you are at risk for this type of infection. Your sexual activity has changed since you were last screened, and you are at increased risk for chlamydia or gonorrhea. Ask your health care provider if you are at risk. Ask your health care provider about whether you are at high risk for HIV.  Your health care provider may recommend a prescription medicine to help prevent HIV infection. If you choose to take medicine to prevent HIV, you should first get tested for HIV. You should then be tested every 3 months for as long as you are taking the medicine. Follow these instructions at home: Lifestyle Do not use any products that contain nicotine or tobacco, such as cigarettes, e-cigarettes, and chewing tobacco. If you need help quitting, ask your health care provider. Do not use street drugs. Do not share needles. Ask your health care provider for help if you need support or information about quitting drugs. Alcohol use Do not drink alcohol if your health care provider tells you not to drink. If you drink alcohol: Limit how much you have to 0-2 drinks a day. Be aware of how much alcohol is in your drink. In the [place] one drink equals one 12 oz bottle of beer (355 mL), one 5 oz glass of wine (148 mL), or one 1 oz glass of hard liquor (44 mL). General instructions Schedule regular health, dental, and eye exams. Stay current with your vaccines. Tell your health care provider if: You often feel depressed. You have ever been abused or do not feel safe at home. Summary Adopting a healthy lifestyle and getting preventive care are important in promoting health and wellness. Follow your health care provider's instructions about healthy diet, exercising, and getting tested or screened for diseases. Follow your health care provider's instructions on monitoring your cholesterol and blood pressure. This information is not intended to replace advice given to you by your health care provider. Make sure you discuss any questions you have with your health care provider. Document Revised: 10/14/2020 Document Reviewed: 07/30/2018 Elsevier Patient Education  2022 [employer].  Call us back with your dose of [employer]

## 2021-05-07 NOTE — Progress Notes (Signed)
I,Tianna Badgett,acting as a Education administrator for [employer], FNP.,have documented all relevant documentation on the behalf of Minette Brine, FNP,as directed by  Minette Brine, FNP while in the presence of Minette Brine, [place].  This visit occurred during the SARS-CoV-2 public health emergency.  Safety protocols were in place, including screening questions prior to the visit, additional usage of staff PPE, and extensive cleaning of exam room while observing appropriate contact time as indicated for disinfecting solutions.  Subjective:     Patient ID: Levi Roberts , male    DOB: 19-May-1956 , 65 y.o.   MRN: [id number]   Chief Complaint  Patient presents with   Annual Exam    HPI  He presents today for physical exam.  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 Patient admits to taking syndjardy 5-500 once a day. He feels like he is taking .5-1094m of synjardy. He has cut back on his snacks. For lunch will eat hot dog all the way, onion rings, diet sprite and for dinner may have fast food (every now and then his wife will cook).  He does admit to eating 3 hotdogs previously now eating one. He also reports eating a pizza on Sundays during Football season  He is established with a [employer]- last visit   Wt Readings from Last 3 Encounters: 05/08/21 : (!) 316 lb 6.4 oz (143.5 kg) 01/30/21 : (!) 312 lb 3.2 oz (141.6 kg) 01/30/21 : (!) 308 lb (139.7 kg)    Diabetes He presents for his follow-up diabetic visit. He has type 2 diabetes mellitus. His disease course has been stable. There are no hypoglycemic associated symptoms. Pertinent negatives for hypoglycemia include no dizziness or headaches. Pertinent negatives for diabetes include no blurred vision, no fatigue, no polydipsia, no polyphagia and no polyuria. There are no hypoglycemic complications. Risk factors for coronary artery disease  include diabetes mellitus, dyslipidemia, hypertension, male sex, obesity and sedentary lifestyle. He is compliant with treatment some of the time. He is following a diabetic diet. He participates in exercise intermittently. His breakfast blood glucose is taken between 8-9 am. His breakfast blood glucose range is generally 130-140 mg/dl. An ACE inhibitor/angiotensin II receptor blocker is being taken.  Hypertension This is a chronic problem. The current episode started more than 1 year ago. The problem has been gradually improving since onset. The problem is controlled. Pertinent negatives include no blurred vision or headaches. Risk factors for coronary artery disease include diabetes mellitus, dyslipidemia, obesity and sedentary lifestyle. Past treatments include angiotensin blockers. The current treatment provides moderate improvement. Compliance problems include exercise.   Hyperlipidemia This is a chronic problem. The current episode started more than 1 year ago. Exacerbating diseases include diabetes and obesity. Current antihyperlipidemic treatment includes statins. Risk factors for coronary artery disease include diabetes mellitus, dyslipidemia, hypertension and male sex.    Past Medical History:  Diagnosis Date   Diabetes mellitus without complication (HCC)    High cholesterol    Hypertension      Family History  Problem Relation Age of Onset   Diabetes Mother    Heart attack Mother    Kidney cancer Father    Diabetes Sister    Healthy Sister      Current Outpatient Medications:    atorvastatin (LIPITOR) 20 MG tablet, Take 1 tablet by mouth daily, Disp: 90 tablet, Rfl: 2   finasteride (PROSCAR) 5  MG tablet, Take 1 tablet (5 mg total) by mouth daily., Disp: 90 tablet, Rfl: 3   nitrofurantoin, macrocrystal-monohydrate, (MACROBID) 100 MG capsule, Take 1 capsule (100 mg total) by mouth 2 (two) times daily for 5 days., Disp: 10 capsule, Rfl: 0   Semaglutide, 1 MG/DOSE, (OZEMPIC, 1  MG/DOSE,) 4 MG/3ML SOPN, Inject 1 mg into the skin once a week., Disp: 9 mL, Rfl: 3   tadalafil (CIALIS) 20 MG tablet, Take 1 tablet (20 mg total) by mouth daily as needed for erectile dysfunction., Disp: 10 tablet, Rfl: 0   telmisartan (MICARDIS) 20 MG tablet, Take 1 tablet (20 mg total) by mouth daily., Disp: 90 tablet, Rfl: 2   TRESIBA FLEXTOUCH 200 UNIT/ML FlexTouch Pen, INJECT 60 UNITS SUBCUTANEOUSLY AT BEDTIME, Disp: 15 mL, Rfl: 2   Empagliflozin-metFORMIN HCl (SYNJARDY) 5-500 MG TABS, Take 1 tablet by mouth daily., Disp: 60 tablet, Rfl: 2   No Known Allergies   Men's preventive visit. Patient Health Questionnaire (PHQ-2) is  [place] Office Visit from 01/30/2021 in [facility]  PHQ-2 Total Score 0      Patient is on a Regular diet, sometimes eats two times a day, does not eat breakfast, eats lunch about 130p and dinner at 8p.the days he is not working he will eat breakfast. Rarely exercises.  Marital status: Married. Relevant history for alcohol use is:  Social History   Substance and Sexual Activity  Alcohol Use Never   Relevant history for tobacco use is:  Social History   Tobacco Use  Smoking Status Never  Smokeless Tobacco Never  .   Review of Systems  Constitutional: Negative.  Negative for fatigue.  HENT: Negative.    Eyes: Negative.  Negative for blurred vision.  Respiratory: Negative.    Cardiovascular: Negative.   Gastrointestinal: Negative.   Endocrine: Negative.  Negative for polydipsia, polyphagia and polyuria.  Genitourinary: Negative.   Musculoskeletal: Negative.   Skin: Negative.   Allergic/Immunologic: Negative.   Neurological: Negative.  Negative for dizziness and headaches.  Hematological: Negative.   Psychiatric/Behavioral: Negative.      Today's Vitals   05/08/21 1409  BP: 138/75  Pulse: 97  Temp: 98.5 F (36.9 C)  TempSrc: Oral  Weight: (!) 316 lb 6.4 oz (143.5 kg)  Height: _0  (1.854 m)   Body mass index  is 41.74 kg/m.  Wt Readings from Last 3 Encounters:  05/08/21 (!) 316 lb 6.4 oz (143.5 kg)  01/30/21 (!) 312 lb 3.2 oz (141.6 kg)  01/30/21 (!) 308 lb (139.7 kg)    BP Readings from Last 3 Encounters:  05/08/21 138/75  01/30/21 122/70  01/30/21 134/78    Objective:  Physical Exam Vitals reviewed.  Constitutional:      General: He is not in acute distress.    Appearance: Normal appearance. He is obese.  HENT:     Head: Normocephalic and atraumatic.     Right Ear: Tympanic membrane, ear canal and external ear normal. There is no impacted cerumen.     Left Ear: Tympanic membrane, ear canal and external ear normal. There is no impacted cerumen.     Nose:     Comments: Deferred - masked     Mouth/Throat:     Comments: Deferred - masked Eyes:     Pupils: Pupils are equal, round, and reactive to light.  Cardiovascular:     Rate and Rhythm: Normal rate and regular rhythm.     Pulses: Normal pulses.  Heart sounds: Normal heart sounds. No murmur heard. Pulmonary:     Effort: Pulmonary effort is normal. No respiratory distress.     Breath sounds: Normal breath sounds.  Abdominal:     General: Abdomen is flat. Bowel sounds are normal. There is no distension.     Palpations: Abdomen is soft.     Tenderness: There is no abdominal tenderness.  Genitourinary:    Comments: Deferred - followed by Urologist Musculoskeletal:        General: No swelling or tenderness. Normal range of motion.     Cervical back: Normal range of motion and neck supple. No tenderness.  Skin:    General: Skin is warm and dry.     Capillary Refill: Capillary refill takes less than 2 seconds.  Neurological:     General: No focal deficit present.     Mental Status: He is alert and oriented to person, place, and time.     Cranial Nerves: No cranial nerve deficit.     Motor: No weakness.  Psychiatric:        Mood and Affect: Mood normal.        Behavior: Behavior normal.        Thought Content: Thought  content normal.        Judgment: Judgment normal.        Assessment And Plan:    1. Encounter for annual physical exam Behavior modifications discussed and diet history reviewed.   Pt will continue to exercise regularly and modify diet with low GI, plant based foods and decrease intake of processed foods.  Recommend intake of daily multivitamin, Vitamin D, and calcium.  Recommend preventive screenings, as well as recommend immunizations that include influenza, TDAP, and Shingles (declined vaccines)  2. Class 3 severe obesity due to excess calories with serious comorbidity and body mass index (BMI) of 40.0 to 44.9 in adult [facility]) Chronic Discussed healthy diet and regular exercise options  Encouraged to exercise at least 150 minutes per week regularly  3. Diabetes mellitus type 2 in obese (HCC) Chronic, poorly controlled He is to call office back with his dose of [employer] says 5-500 two times a day but he has only been taking once a day Encouraged to limit intake of sugary foods and drinks, advised him he needs to decrease the hotdogs and pizza due to the high carbs and poorly controlled diabetes Encouraged to increase physical activity to 150 minutes per week Diabetic foot exam done, no abnormal findings - POCT Urinalysis Dipstick (81002) - POCT UA - Microalbumin - EKG 12-Lead - CBC - Hemoglobin A1c - CMP14+EGFR - Empagliflozin-metFORMIN HCl (SYNJARDY) 5-500 MG TABS; Take 1 tablet by mouth daily.  Dispense: 60 tablet; Refill: 2  4. Essential hypertension B/P is fairly controlled.  CMP ordered to check renal function.  The importance of regular exercise and dietary modification was stressed to the patient.  Stressed importance of losing ten percent of her body weight to help with B/P control.  The weight loss would help with decreasing cardiac and cancer risk as well.  EKG done no changes from previous - EKG 12-Lead  5. Pure hypercholesterolemia Chronic,  controlled Continue with current medications, tolerating well - Lipid panel  6. Benign prostatic hyperplasia with lower urinary tract symptoms, symptom details unspecified Continue follow up with Urology  7. Urinary tract infection without hematuria, site unspecified Called patient to inform has urinary tract infection and have started on antibiotics Had positive nitrates - Urine Culture - nitrofurantoin,  macrocrystal-monohydrate, (MACROBID) 100 MG capsule; Take 1 capsule (100 mg total) by mouth 2 (two) times daily for 5 days.  Dispense: 10 capsule; Refill: 0 He is encouraged to initially strive for BMI less than 30 to decrease cardiac risk. He is advised to exercise no less than 150 minutes per week.     Patient was given opportunity to ask questions. Patient verbalized understanding of the plan and was able to repeat key elements of the plan. All questions were answered to their satisfaction.   Minette Brine, FNP   I, Minette Brine, FNP, have reviewed all documentation for this visit. The documentation on 05/08/21 for the exam, diagnosis, procedures, and orders are all accurate and complete.   THE PATIENT IS ENCOURAGED TO PRACTICE SOCIAL DISTANCING DUE TO THE COVID-19 PANDEMIC.

## 2021-05-08 ENCOUNTER — Other Ambulatory Visit: Payer: Self-pay

## 2021-05-08 ENCOUNTER — Encounter: Payer: Self-pay | Admitting: Nurse Practitioner

## 2021-05-08 ENCOUNTER — Ambulatory Visit (INDEPENDENT_AMBULATORY_CARE_PROVIDER_SITE_OTHER): Payer: 59 | Admitting: Nurse Practitioner

## 2021-05-08 VITALS — BP 138/75 | HR 97 | Temp 98.5°F | Ht 73.0 in | Wt 316.4 lb

## 2021-05-08 DIAGNOSIS — I10 Essential (primary) hypertension: Secondary | ICD-10-CM | POA: Diagnosis not present

## 2021-05-08 DIAGNOSIS — E1169 Type 2 diabetes mellitus with other specified complication: Secondary | ICD-10-CM | POA: Diagnosis not present

## 2021-05-08 DIAGNOSIS — Z0000 Encounter for general adult medical examination without abnormal findings: Secondary | ICD-10-CM

## 2021-05-08 DIAGNOSIS — E669 Obesity, unspecified: Secondary | ICD-10-CM

## 2021-05-08 DIAGNOSIS — N390 Urinary tract infection, site not specified: Secondary | ICD-10-CM

## 2021-05-08 DIAGNOSIS — Z6841 Body Mass Index (BMI) 40.0 and over, adult: Secondary | ICD-10-CM

## 2021-05-08 DIAGNOSIS — N401 Enlarged prostate with lower urinary tract symptoms: Secondary | ICD-10-CM

## 2021-05-08 DIAGNOSIS — E7800 Pure hypercholesterolemia, unspecified: Secondary | ICD-10-CM | POA: Diagnosis not present

## 2021-05-08 LAB — POCT UA - MICROALBUMIN
Albumin/Creatinine Ratio, Urine, POC: <30
Creatinine, POC: 100 mg/dL
Microalbumin Ur, POC: 10 mg/L

## 2021-05-08 LAB — POCT URINALYSIS DIPSTICK
Bilirubin, UA: NEGATIVE
Blood, UA: NEGATIVE
Glucose, UA: POSITIVE — AB
Ketones, UA: NEGATIVE
Nitrite, UA: POSITIVE
Protein, UA: NEGATIVE
Spec Grav, UA: 1.015 (ref 1.010–1.025)
Urobilinogen, UA: 1 E.U./dL
pH, UA: 5.5 (ref 5.0–8.0)

## 2021-05-08 MED ORDER — SYNJARDY 5-500 MG PO TABS: 1.0000 | ORAL_TABLET | Freq: Every day | ORAL | 2 refills | Status: DC

## 2021-05-08 MED ORDER — NITROFURANTOIN MONOHYD MACRO 100 MG PO CAPS
100.0000 mg | ORAL_CAPSULE | Freq: Two times a day (BID) | ORAL | 0 refills | Status: AC
Start: 2021-05-08 — End: 2021-05-13

## 2021-05-09 ENCOUNTER — Other Ambulatory Visit: Payer: Self-pay

## 2021-05-09 LAB — CBC
Hematocrit: 41.8 % (ref 37.5–51.0)
Hemoglobin: 13.5 g/dL (ref 13.0–17.7)
MCH: 27.1 pg (ref 26.6–33.0)
MCHC: 32.3 g/dL (ref 31.5–35.7)
MCV: 84 fL (ref 79–97)
Platelets: 224 10*3/uL (ref 150–450)
RBC: 4.99 x10E6/uL (ref 4.14–5.80)
RDW: 14.5 % (ref 11.6–15.4)
WBC: 4.3 10*3/uL (ref 3.4–10.8)

## 2021-05-09 LAB — HEMOGLOBIN A1C
Est. average glucose Bld gHb Est-mCnc: 214 mg/dL
Hgb A1c MFr Bld: 9.1 % — ABNORMAL HIGH (ref 4.8–5.6)

## 2021-05-09 LAB — CMP14+EGFR
ALT: 10 IU/L (ref 0–44)
AST: 11 IU/L (ref 0–40)
Albumin/Globulin Ratio: 1.2 (ref 1.2–2.2)
Albumin: 4.1 g/dL (ref 3.8–4.8)
Alkaline Phosphatase: 70 IU/L (ref 44–121)
BUN/Creatinine Ratio: 13 (ref 10–24)
BUN: 14 mg/dL (ref 8–27)
Bilirubin Total: 0.5 mg/dL (ref 0.0–1.2)
CO2: 26 mmol/L (ref 20–29)
Calcium: 9.2 mg/dL (ref 8.6–10.2)
Chloride: 103 mmol/L (ref 96–106)
Creatinine, Ser: 1.12 mg/dL (ref 0.76–1.27)
Globulin, Total: 3.5 g/dL (ref 1.5–4.5)
Glucose: 89 mg/dL (ref 65–99)
Potassium: 4.9 mmol/L (ref 3.5–5.2)
Sodium: 140 mmol/L (ref 134–144)
Total Protein: 7.6 g/dL (ref 6.0–8.5)
eGFR: 74 mL/min/{1.73_m2} (ref >59)

## 2021-05-09 LAB — LIPID PANEL
Chol/HDL Ratio: 2.8 ratio (ref 0.0–5.0)
Cholesterol, Total: 112 mg/dL (ref 100–199)
HDL: 40 mg/dL (ref >39)
LDL Chol Calc (NIH): 57 mg/dL (ref 0–99)
Triglycerides: 75 mg/dL (ref 0–149)
VLDL Cholesterol Cal: 15 mg/dL (ref 5–40)

## 2021-05-12 ENCOUNTER — Other Ambulatory Visit: Payer: Self-pay | Admitting: Nurse Practitioner

## 2021-05-12 LAB — URINE CULTURE

## 2021-06-08 ENCOUNTER — Encounter: Payer: Self-pay | Admitting: Internal Medicine

## 2021-06-09 ENCOUNTER — Other Ambulatory Visit: Payer: Self-pay

## 2021-06-09 DIAGNOSIS — E669 Obesity, unspecified: Secondary | ICD-10-CM

## 2021-06-09 DIAGNOSIS — E1169 Type 2 diabetes mellitus with other specified complication: Secondary | ICD-10-CM

## 2021-06-09 MED ORDER — SYNJARDY 5-500 MG PO TABS
1.0000 | ORAL_TABLET | Freq: Every day | ORAL | 1 refills | Status: DC
Start: 2021-06-09 — End: 2021-10-23

## 2021-06-19 ENCOUNTER — Other Ambulatory Visit: Payer: Self-pay

## 2021-06-19 MED ORDER — TRESIBA FLEXTOUCH 200 UNIT/ML ~~LOC~~ SOPN: PEN_INJECTOR | SUBCUTANEOUS | 2 refills | Status: DC

## 2021-06-23 ENCOUNTER — Encounter: Payer: Self-pay | Admitting: Nurse Practitioner

## 2021-07-17 ENCOUNTER — Other Ambulatory Visit: Payer: Self-pay

## 2021-07-17 ENCOUNTER — Other Ambulatory Visit: Payer: Self-pay | Admitting: Internal Medicine

## 2021-07-17 MED ORDER — TELMISARTAN 20 MG PO TABS: 20.0000 mg | ORAL_TABLET | Freq: Every day | ORAL | 0 refills | Status: DC

## 2021-08-08 ENCOUNTER — Encounter: Payer: Self-pay | Admitting: Internal Medicine

## 2021-08-08 LAB — HM COLONOSCOPY

## 2021-08-28 ENCOUNTER — Ambulatory Visit: Payer: 59 | Admitting: Nurse Practitioner

## 2021-09-14 ENCOUNTER — Other Ambulatory Visit: Payer: Self-pay | Admitting: Internal Medicine

## 2021-09-14 MED ORDER — TELMISARTAN 20 MG PO TABS: 20.0000 mg | ORAL_TABLET | Freq: Every day | ORAL | 0 refills | Status: DC

## 2021-09-16 ENCOUNTER — Other Ambulatory Visit: Payer: Self-pay | Admitting: Internal Medicine

## 2021-09-21 ENCOUNTER — Other Ambulatory Visit: Payer: Self-pay | Admitting: Internal Medicine

## 2021-09-25 ENCOUNTER — Other Ambulatory Visit: Payer: Self-pay | Admitting: Internal Medicine

## 2021-09-29 ENCOUNTER — Other Ambulatory Visit: Payer: Self-pay | Admitting: Internal Medicine

## 2021-10-02 ENCOUNTER — Other Ambulatory Visit: Payer: Self-pay | Admitting: Internal Medicine

## 2021-10-19 IMAGING — US US RENAL
1 series · 14 of 25 positions shown · non-contrast
Comparison: None.

CLINICAL DATA: Urethral bleeding

EXAM:
RENAL / URINARY TRACT ULTRASOUND COMPLETE

[Series 1: us renal · 14 of 49 slices shown]
[im 1/49]
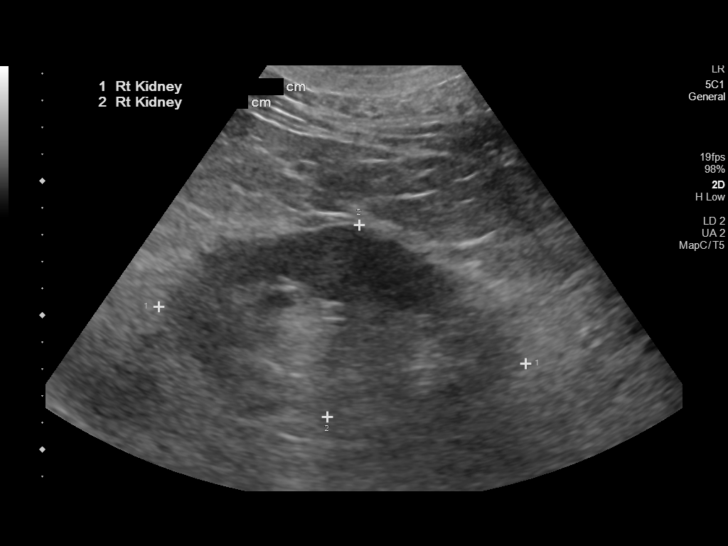
[im 5/49]
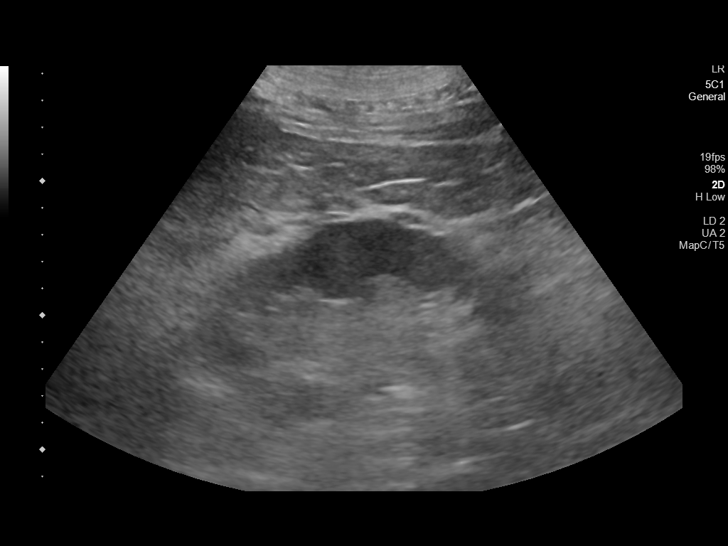
[im 9/49]
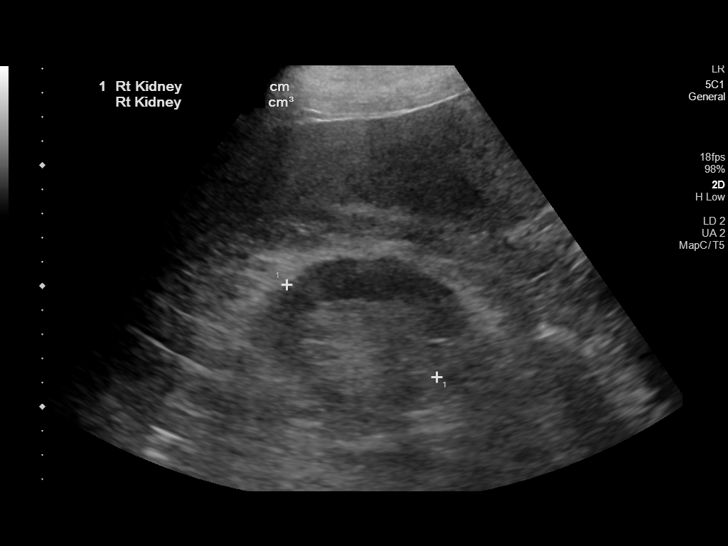
[im 13/49]
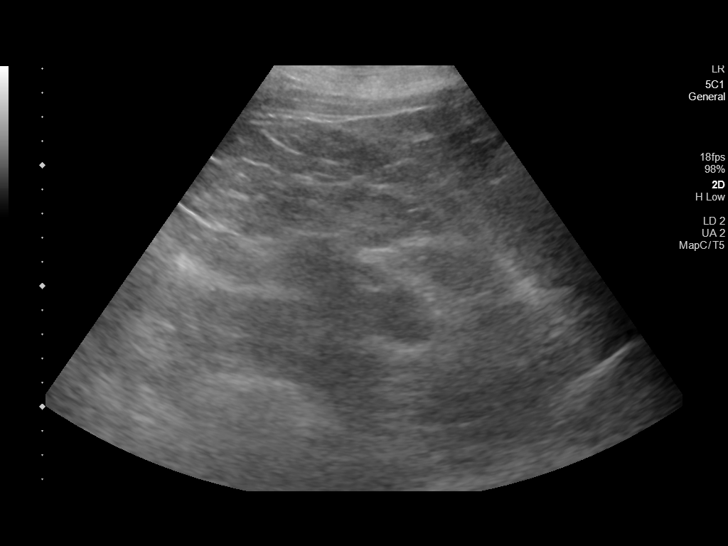
[im 17/49]
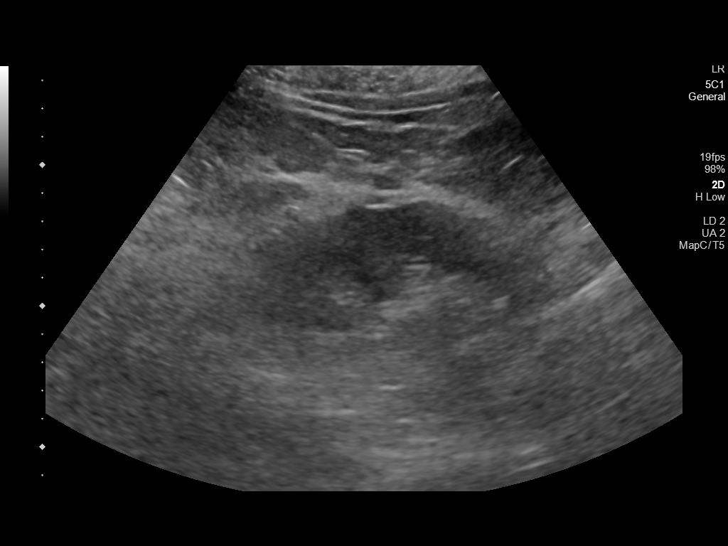
[im 19/49]
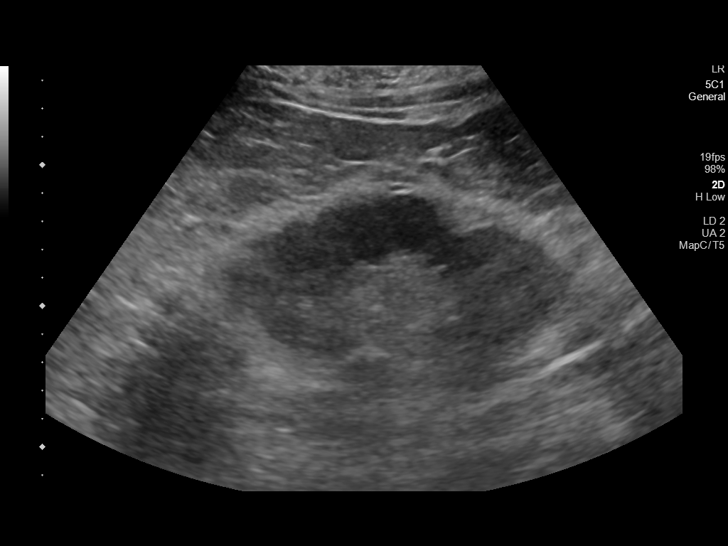
[im 23/49]
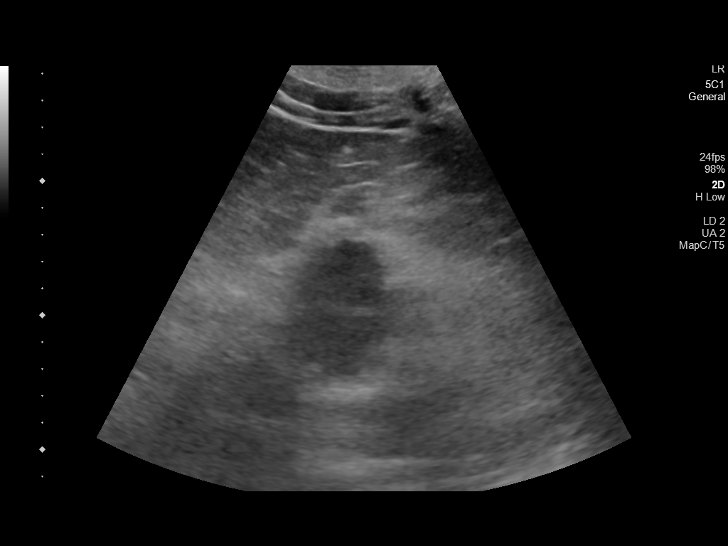
[im 27/49]
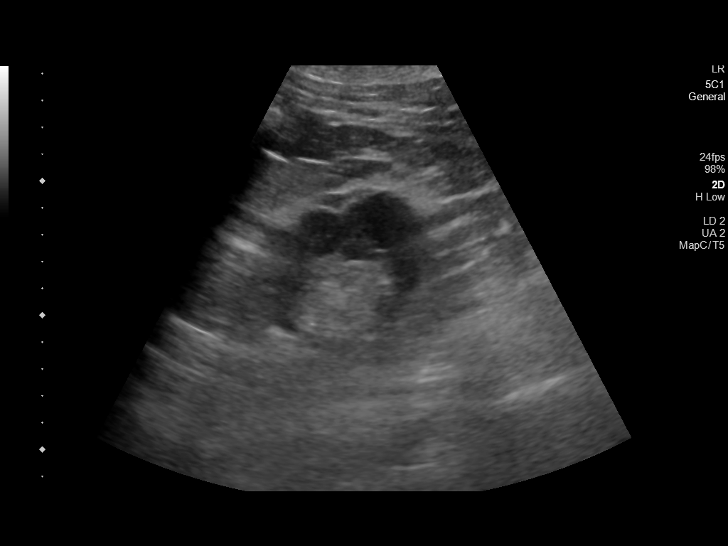
[im 31/49]
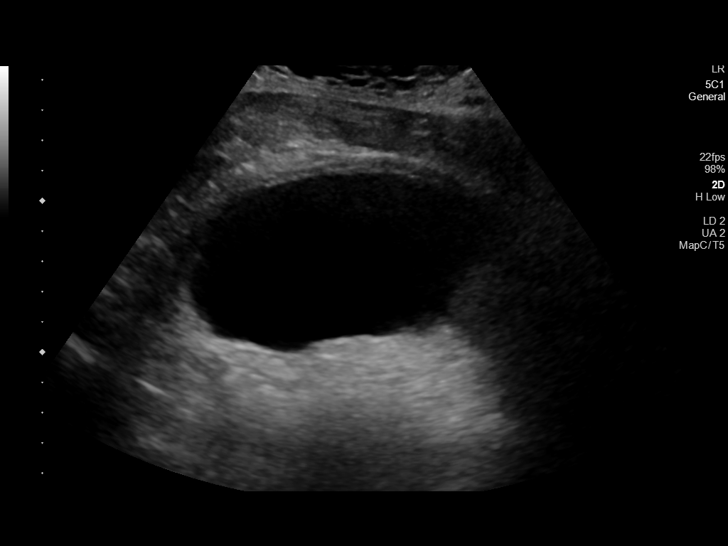
[im 33/49]
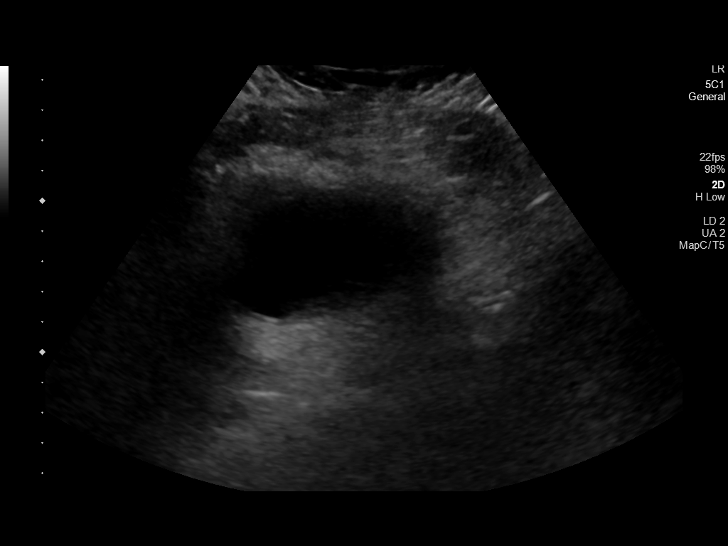
[im 37/49]
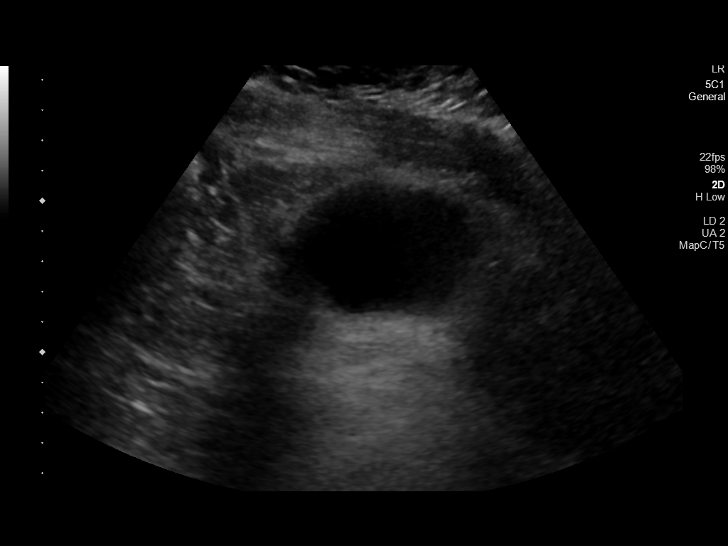
[im 41/49]
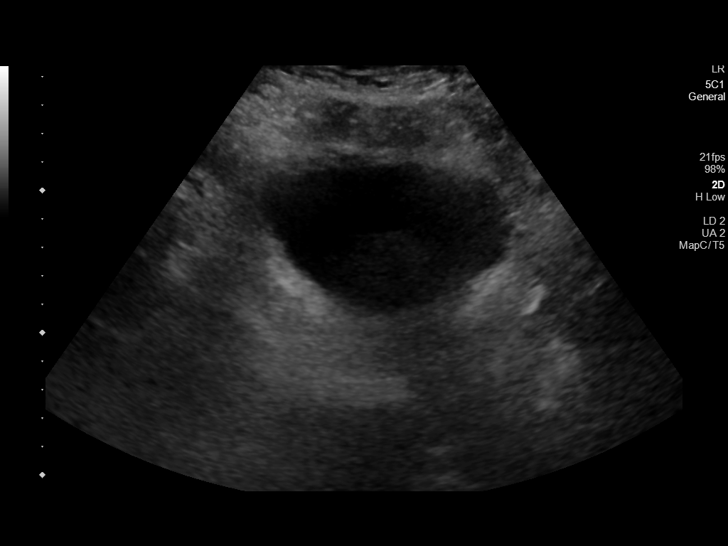
[im 45/49]
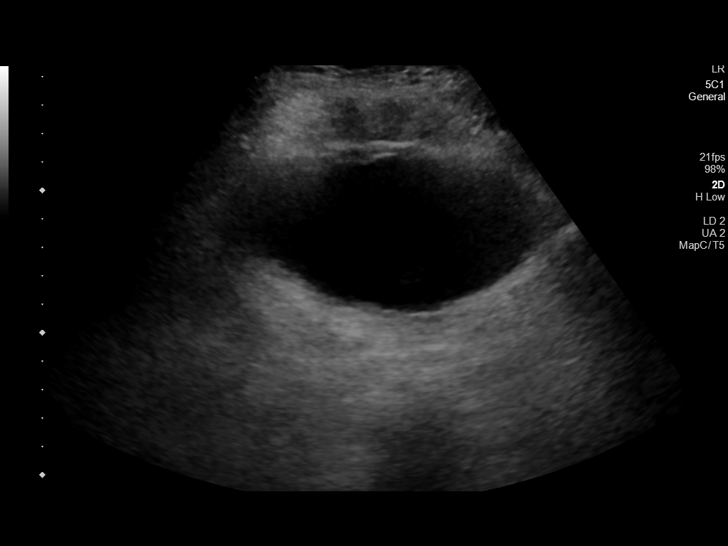
[im 49/49]
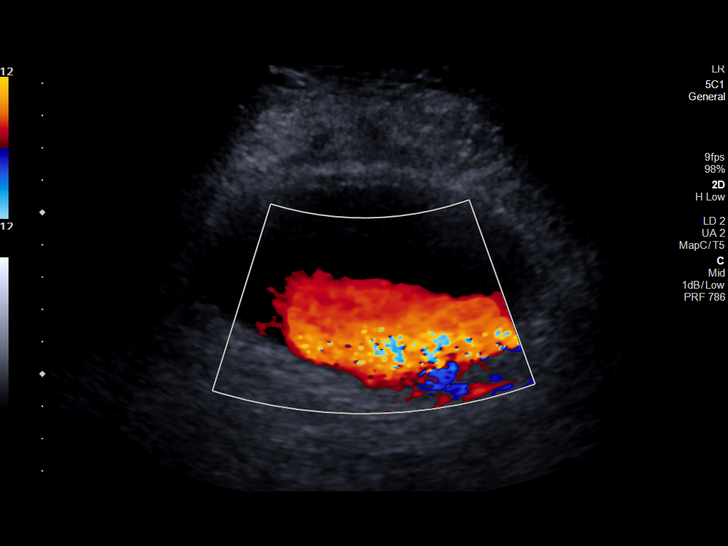

[14 of 25 positions shown; findings below may reference images not displayed]

FINDINGS: Right Kidney:

Renal measurements: 13.8 x 7.3 x 7.3 cm = volume: 383 mL .
Echogenicity and renal cortical thickness are within normal limits.
No mass, perinephric fluid, or hydronephrosis visualized. No
sonographically demonstrable calculus or ureterectasis.

Left Kidney:

Renal measurements: 12.4 x 5.9 x 5.2 cm = volume: 199 mL.
Echogenicity and renal cortical thickness are within normal limits.
No mass, perinephric fluid, or hydronephrosis visualized. No
sonographically demonstrable calculus or ureterectasis.

Bladder:

Appears normal for degree of bladder distention.

Other:

Prostate measures 9.0 x 5.4 x 7.5 cm with a measured volume of 191
cubic cm.
IMPRESSION: Enlarged prostate.  Study otherwise unremarkable.

## 2021-10-23 ENCOUNTER — Encounter: Payer: Self-pay | Admitting: Nurse Practitioner

## 2021-10-23 ENCOUNTER — Ambulatory Visit: Payer: 59 | Admitting: Nurse Practitioner

## 2021-10-23 ENCOUNTER — Other Ambulatory Visit: Payer: Self-pay

## 2021-10-23 VITALS — BP 130/78 | HR 110 | Temp 98.5°F | Ht 73.0 in | Wt 320.0 lb

## 2021-10-23 DIAGNOSIS — E1169 Type 2 diabetes mellitus with other specified complication: Secondary | ICD-10-CM | POA: Diagnosis not present

## 2021-10-23 DIAGNOSIS — I10 Essential (primary) hypertension: Secondary | ICD-10-CM

## 2021-10-23 DIAGNOSIS — E669 Obesity, unspecified: Secondary | ICD-10-CM

## 2021-10-23 DIAGNOSIS — E7800 Pure hypercholesterolemia, unspecified: Secondary | ICD-10-CM | POA: Diagnosis not present

## 2021-10-23 DIAGNOSIS — Z6841 Body Mass Index (BMI) 40.0 and over, adult: Secondary | ICD-10-CM

## 2021-10-23 MED ORDER — SYNJARDY 5-500 MG PO TABS: 1.0000 | ORAL_TABLET | Freq: Every day | ORAL | 1 refills | Status: DC

## 2021-10-23 MED ORDER — OZEMPIC (2 MG/DOSE) 8 MG/3ML ~~LOC~~ SOPN: 2.0000 mg | PEN_INJECTOR | SUBCUTANEOUS | 1 refills | Status: DC

## 2021-10-23 NOTE — Patient Instructions (Signed)

## 2021-10-23 NOTE — Progress Notes (Signed)
?Industrial/product designer as a Education administrator for [employer], FNP.,have documented all relevant documentation on the behalf of Minette Brine, FNP,as directed by  Minette Brine, FNP while in the presence of Minette Brine, [place]. ? ?This visit occurred during the SARS-CoV-2 public health emergency.  Safety protocols were in place, including screening questions prior to the visit, additional usage of staff PPE, and extensive cleaning of exam room while observing appropriate contact time as indicated for disinfecting solutions. ? ?Subjective:  ?  ? Patient ID: Levi Roberts , male    DOB: 09-10-1955 , 66 y.o.   MRN: [id number] ? ? ?Chief Complaint  ?Patient presents with  ? Diabetes  ? Hypertension  ? ? ?HPI ? ?He presents today for DM/HTN f/u. He is no longer taking synjardy due to having to go the bathroom all the time.  ? ?Diabetes ?He presents for his follow-up diabetic visit. He has type 2 diabetes mellitus. His disease course has been stable. There are no hypoglycemic associated symptoms. Pertinent negatives for hypoglycemia include no dizziness or headaches. Pertinent negatives for diabetes include no blurred vision, no fatigue, no polydipsia, no polyphagia and no polyuria. There are no hypoglycemic complications. Risk factors for coronary artery disease include diabetes mellitus, dyslipidemia, hypertension, male sex, obesity and sedentary lifestyle. He is compliant with treatment some of the time. He is following a diabetic diet. He participates in exercise intermittently. His breakfast blood glucose is taken between 8-9 am. His breakfast blood glucose range is generally 130-140 mg/dl. An ACE inhibitor/angiotensin II receptor blocker is being taken.  ?Hypertension ?This is a chronic problem. The current episode started more than 1 year ago. The problem has been gradually improving since onset. The problem is controlled. Pertinent negatives include no blurred vision or headaches. Risk factors for coronary artery  disease include diabetes mellitus, dyslipidemia, obesity and sedentary lifestyle. Past treatments include angiotensin blockers. The current treatment provides moderate improvement. Compliance problems include exercise.   ?Hyperlipidemia ?This is a chronic problem. The current episode started more than 1 year ago. Exacerbating diseases include diabetes and obesity. Current antihyperlipidemic treatment includes statins. Risk factors for coronary artery disease include diabetes mellitus, dyslipidemia, hypertension and male sex.   ? ?Past Medical History:  ?Diagnosis Date  ? Diabetes mellitus without complication ([place])   ? High cholesterol   ? Hypertension   ?  ? ?Family History  ?Problem Relation Age of Onset  ? Diabetes Mother   ? Heart attack Mother   ? Kidney cancer Father   ? Diabetes Sister   ? Healthy Sister   ? ? ? ?Current Outpatient Medications:  ?  atorvastatin (LIPITOR) 20 MG tablet, Take 1 tablet by mouth daily, Disp: 90 tablet, Rfl: 2 ?  finasteride (PROSCAR) 5 MG tablet, Take 1 tablet (5 mg total) by mouth daily., Disp: 90 tablet, Rfl: 3 ?  Semaglutide, 2 MG/DOSE, (OZEMPIC, 2 MG/DOSE,) 8 MG/3ML SOPN, Inject 2 mg into the skin once a week., Disp: 9 mL, Rfl: 1 ?  tadalafil (CIALIS) 20 MG tablet, Take 1 tablet (20 mg total) by mouth daily as needed for erectile dysfunction., Disp: 10 tablet, Rfl: 0 ?  telmisartan (MICARDIS) 20 MG tablet, Take 1 tablet (20 mg total) by mouth daily., Disp: 90 tablet, Rfl: 0 ?  TRESIBA FLEXTOUCH 200 UNIT/ML FlexTouch Pen, INJECT 60 UNITS SUBCUTANEOUSLY AT BEDTIME, Disp: 9 mL, Rfl: 0 ?  Empagliflozin-metFORMIN HCl (SYNJARDY) 5-500 MG TABS, Take 1 tablet by mouth daily., Disp: 90 tablet, Rfl: 1  ? ?No  Known Allergies  ? ?Review of Systems  ?Constitutional: Negative.  Negative for fatigue.  ?Eyes:  Negative for blurred vision.  ?Respiratory: Negative.    ?Cardiovascular: Negative.   ?Gastrointestinal: Negative.   ?Endocrine: Negative for polydipsia, polyphagia and polyuria.   ?Neurological: Negative.  Negative for dizziness and headaches.  ?Psychiatric/Behavioral: Negative.     ? ?Today's Vitals  ? 10/23/21 1612  ?BP: 130/78  ?Pulse: (!) 110  ?Temp: 98.5 ?F (36.9 ?C)  ?TempSrc: Oral  ?Weight: (!) 320 lb (145.2 kg)  ?Height: '6\' 1"'  (1.854 m)  ? ?Body mass index is 42.22 kg/m?.  ?Wt Readings from Last 3 Encounters:  ?10/23/21 (!) 320 lb (145.2 kg)  ?05/08/21 (!) 316 lb 6.4 oz (143.5 kg)  ?01/30/21 (!) 312 lb 3.2 oz (141.6 kg)  ? ? ?Objective:  ?Physical Exam ?Vitals reviewed.  ?Constitutional:   ?   General: He is not in acute distress. ?   Appearance: Normal appearance. He is obese.  ?Cardiovascular:  ?   Rate and Rhythm: Normal rate and regular rhythm.  ?   Pulses: Normal pulses.  ?   Heart sounds: Normal heart sounds. No murmur heard. ?Pulmonary:  ?   Effort: Pulmonary effort is normal. No respiratory distress.  ?   Breath sounds: Normal breath sounds. No wheezing.  ?Musculoskeletal:     ?   General: Normal range of motion.  ?Skin: ?   General: Skin is warm and dry.  ?   Capillary Refill: Capillary refill takes less than 2 seconds.  ?Neurological:  ?   General: No focal deficit present.  ?   Mental Status: He is alert and oriented to person, place, and time.  ?   Cranial Nerves: No cranial nerve deficit.  ?   Motor: No weakness.  ?Psychiatric:     ?   Mood and Affect: Mood normal.     ?   Behavior: Behavior normal.     ?   Thought Content: Thought content normal.     ?   Judgment: Judgment normal.  ?  ? ?   ?Assessment And Plan:  ?   ?1. Diabetes mellitus type 2 in obese [facility]) ?Comments: Has not been taking synjardy due to side effect of frequency in urination. Will increase Ozempic to 2 mg weekly, HgbA1c was 9.1. I have stressed the importance of taking his medications as directed. Requested records from [facility] consent form completed ?- Hemoglobin A1c ?- Semaglutide, 2 MG/DOSE, (OZEMPIC, 2 MG/DOSE,) 8 MG/3ML SOPN; Inject 2 mg into the skin once a week.  Dispense: 9 mL; Refill:  1 ?- Empagliflozin-metFORMIN HCl (SYNJARDY) 5-500 MG TABS; Take 1 tablet by mouth daily.  Dispense: 90 tablet; Refill: 1 ? ?2. Essential hypertension ?Comments: Blood pressure is normal, continue current medications are normal.  ?- CMP14+EGFR ? ?3. Pure hypercholesterolemia ?Comments: Cholesterol levels are normal with last visit, continue statin, tolerating well ?- Lipid panel ? ?4. Class 3 severe obesity due to excess calories with serious comorbidity and body mass index (BMI) of 40.0 to 44.9 in adult [facility]) ?He is encouraged to strive for BMI less than 30 to decrease cardiac risk. Advised to aim for at least 150 minutes of exercise per week. ?  ? ?Patient was given opportunity to ask questions. Patient verbalized understanding of the plan and was able to repeat key elements of the plan. All questions were answered to their satisfaction.  ?Minette Brine, FNP  ? ?I, Minette Brine, FNP, have reviewed all documentation  for this visit. The documentation on 10/23/21 for the exam, diagnosis, procedures, and orders are all accurate and complete.  ? ?IF YOU HAVE BEEN REFERRED TO A SPECIALIST, IT MAY TAKE 1-2 WEEKS TO SCHEDULE/PROCESS THE REFERRAL. IF YOU HAVE NOT HEARD FROM US/SPECIALIST IN TWO WEEKS, PLEASE GIVE [place] A CALL AT [phone].  ? ?THE PATIENT IS ENCOURAGED TO PRACTICE SOCIAL DISTANCING DUE TO THE COVID-19 PANDEMIC.   ?

## 2021-10-24 LAB — CMP14+EGFR
ALT: 12 IU/L (ref 0–44)
AST: 10 IU/L (ref 0–40)
Albumin/Globulin Ratio: 1.1 — ABNORMAL LOW (ref 1.2–2.2)
Albumin: 3.9 g/dL (ref 3.8–4.8)
Alkaline Phosphatase: 87 IU/L (ref 44–121)
BUN/Creatinine Ratio: 13 (ref 10–24)
BUN: 13 mg/dL (ref 8–27)
Bilirubin Total: 0.4 mg/dL (ref 0.0–1.2)
CO2: 21 mmol/L (ref 20–29)
Calcium: 9.1 mg/dL (ref 8.6–10.2)
Chloride: 99 mmol/L (ref 96–106)
Creatinine, Ser: 1.04 mg/dL (ref 0.76–1.27)
Globulin, Total: 3.5 g/dL (ref 1.5–4.5)
Glucose: 291 mg/dL — ABNORMAL HIGH (ref 70–99)
Potassium: 4.9 mmol/L (ref 3.5–5.2)
Sodium: 133 mmol/L — ABNORMAL LOW (ref 134–144)
Total Protein: 7.4 g/dL (ref 6.0–8.5)
eGFR: 80 mL/min/{1.73_m2} (ref >59)

## 2021-10-24 LAB — LIPID PANEL
Chol/HDL Ratio: 3.7 ratio (ref 0.0–5.0)
Cholesterol, Total: 131 mg/dL (ref 100–199)
HDL: 35 mg/dL — ABNORMAL LOW (ref >39)
LDL Chol Calc (NIH): 64 mg/dL (ref 0–99)
Triglycerides: 192 mg/dL — ABNORMAL HIGH (ref 0–149)
VLDL Cholesterol Cal: 32 mg/dL (ref 5–40)

## 2021-10-24 LAB — HEMOGLOBIN A1C
Est. average glucose Bld gHb Est-mCnc: 278 mg/dL
Hgb A1c MFr Bld: 11.3 % — ABNORMAL HIGH (ref 4.8–5.6)

## 2021-11-01 ENCOUNTER — Other Ambulatory Visit: Payer: Self-pay | Admitting: Internal Medicine

## 2021-11-02 ENCOUNTER — Other Ambulatory Visit: Payer: Self-pay

## 2021-11-02 MED ORDER — TRESIBA FLEXTOUCH 200 UNIT/ML ~~LOC~~ SOPN: PEN_INJECTOR | SUBCUTANEOUS | 0 refills | Status: DC

## 2021-11-07 ENCOUNTER — Other Ambulatory Visit: Payer: Self-pay | Admitting: Internal Medicine

## 2021-11-07 ENCOUNTER — Other Ambulatory Visit: Payer: Self-pay

## 2021-11-07 MED ORDER — TRESIBA FLEXTOUCH 200 UNIT/ML ~~LOC~~ SOPN: PEN_INJECTOR | SUBCUTANEOUS | 1 refills | Status: DC

## 2022-01-04 ENCOUNTER — Other Ambulatory Visit: Payer: Self-pay | Admitting: Internal Medicine

## 2022-01-26 ENCOUNTER — Other Ambulatory Visit: Payer: Self-pay | Admitting: Internal Medicine

## 2022-01-29 MED ORDER — TRESIBA FLEXTOUCH 200 UNIT/ML ~~LOC~~ SOPN: PEN_INJECTOR | SUBCUTANEOUS | 0 refills | Status: DC

## 2022-01-29 MED ORDER — TELMISARTAN 20 MG PO TABS: 20.0000 mg | ORAL_TABLET | Freq: Every day | ORAL | 0 refills | Status: DC

## 2022-01-31 ENCOUNTER — Ambulatory Visit: Payer: Self-pay | Admitting: Urology

## 2022-02-07 ENCOUNTER — Ambulatory Visit: Payer: 59 | Admitting: Urology

## 2022-02-07 ENCOUNTER — Encounter: Payer: Self-pay | Admitting: Urology

## 2022-02-07 VITALS — BP 133/77 | HR 96 | Ht 73.0 in | Wt 314.0 lb

## 2022-02-07 DIAGNOSIS — N5201 Erectile dysfunction due to arterial insufficiency: Secondary | ICD-10-CM

## 2022-02-07 DIAGNOSIS — R972 Elevated prostate specific antigen [PSA]: Secondary | ICD-10-CM

## 2022-02-07 DIAGNOSIS — N401 Enlarged prostate with lower urinary tract symptoms: Secondary | ICD-10-CM

## 2022-02-07 LAB — BLADDER SCAN AMB NON-IMAGING: SCA Interp: 484

## 2022-02-07 MED ORDER — TAMSULOSIN HCL 0.4 MG PO CAPS: 0.4000 mg | ORAL_CAPSULE | Freq: Every day | ORAL | 0 refills | Status: DC

## 2022-02-07 NOTE — Progress Notes (Signed)
02/07/2022 3:14 PM   Levi Roberts 1956-02-21 [id number]  Referring provider: Dorothyann Peng, MD [address] [place],  [place] [address]  Chief Complaint  Patient presents with   Erectile Dysfunction    Urologic history: BPH with LUTS Previously on finasteride which was restarted May 2021  2.  Elevated PSA 2 prior negative biopsies; Dr. Vernie Ammons at [facility]  3.  Urethral bleeding Cystoscopy 12/2019 with BPH  4.  Erectile dysfunction On sildenafil   HPI: 66 y.o. male presents for annual follow-up.  He has noted worsening LUTS since last years visit.  Remains on finasteride.  Most bothersome symptoms are urgency and weak stream IPSS today 14/35  Denies recurrent urethral bleeding, hematuria or dysuria No flank, abdominal or pelvic pain Tadalafil and sildenafil have not been effective    PMH: Past Medical History:  Diagnosis Date   Diabetes mellitus without complication (HCC)    High cholesterol    Hypertension     Surgical History: History reviewed. No pertinent surgical history.  Home Medications:  Allergies as of 02/07/2022   No Known Allergies      Medication List        Accurate as of February 07, 2022  3:14 PM. If you have any questions, ask your nurse or doctor.          STOP taking these medications    Synjardy 5-500 MG Tabs Generic drug: Empagliflozin-metFORMIN HCl Stopped by: Riki Altes, MD   tadalafil 20 MG tablet Commonly known as: CIALIS Stopped by: Riki Altes, MD       TAKE these medications    atorvastatin 20 MG tablet Commonly known as: LIPITOR Take 1 tablet by mouth daily   finasteride 5 MG tablet Commonly known as: PROSCAR Take 1 tablet (5 mg total) by mouth daily.   Ozempic (2 MG/DOSE) 8 MG/3ML Sopn Generic drug: Semaglutide (2 MG/DOSE) Inject 2 mg into the skin once a week.   telmisartan 20 MG tablet Commonly known as: MICARDIS Take 1 tablet (20 mg total) by mouth daily.    [name] FlexTouch 200 UNIT/ML FlexTouch Pen Generic drug: insulin degludec INJECT 60 UNITS SUBCUTANEOUSLY EVERY DAY AT BEDTIME        Allergies: No Known Allergies  Family History: Family History  Problem Relation Age of Onset   Diabetes Mother    Heart attack Mother    Kidney cancer Father    Diabetes Sister    Healthy Sister     Social History:  reports that he has never smoked. He has never used smokeless tobacco. He reports that he does not drink alcohol and does not use drugs.   Physical Exam: BP 133/77   Pulse 96   Ht 6\' 1"  (1.854 m)   Wt (!) 314 lb (142.4 kg)   BMI 41.43 kg/m   Constitutional:  Alert and oriented, No acute distress. HEENT: [place] AT, moist mucus membranes.  Trachea midline, no masses. Cardiovascular: No clubbing, cyanosis, or edema. Respiratory: Normal respiratory effort, no increased work of breathing. GI: Abdomen is soft, nontender, nondistended, no abdominal masses GU: Prostate only lower one half palpable, no nodules or induration; estimated size greater than sign 30 g Skin: No rashes, bruises or suspicious lesions. Neurologic: Grossly intact, no focal deficits, moving all 4 extremities. Psychiatric: Normal mood and affect.   Assessment & Plan:    1.  BPH with LUTS Worsening LUTS Bladder scan today 484 mL Declined Foley catheter placement Start tamsulosin 0.4 mg Finasteride  refilled 1 month follow-up with symptom check and repeat PVR  2.  Elevated PSA PSA on follow-up  3.  Erectile dysfunction PDE 5 inhibitor refractory Has also tried intracavernosal injections and vacuum erection devices May be interested in pursuing penile prosthesis and if desired will refer to [facility] or [facility]    Riki Altes, MD  [facility] [address], [address] [place], [place] [address] [phone]

## 2022-02-08 ENCOUNTER — Encounter: Payer: Self-pay | Admitting: Urology

## 2022-02-08 ENCOUNTER — Other Ambulatory Visit: Payer: Self-pay | Admitting: Internal Medicine

## 2022-02-23 ENCOUNTER — Other Ambulatory Visit: Payer: Self-pay | Admitting: *Deleted

## 2022-02-23 DIAGNOSIS — R972 Elevated prostate specific antigen [PSA]: Secondary | ICD-10-CM

## 2022-02-25 ENCOUNTER — Other Ambulatory Visit: Payer: Self-pay | Admitting: Internal Medicine

## 2022-02-28 ENCOUNTER — Other Ambulatory Visit: Payer: Self-pay | Admitting: Urology

## 2022-03-12 ENCOUNTER — Ambulatory Visit: Payer: 59 | Admitting: Nurse Practitioner

## 2022-03-12 ENCOUNTER — Ambulatory Visit: Payer: 59 | Admitting: Internal Medicine

## 2022-03-13 ENCOUNTER — Other Ambulatory Visit: Payer: 59

## 2022-03-13 DIAGNOSIS — R972 Elevated prostate specific antigen [PSA]: Secondary | ICD-10-CM

## 2022-03-14 LAB — PSA: Prostate Specific Ag, Serum: 3.4 ng/mL (ref 0.0–4.0)

## 2022-03-25 ENCOUNTER — Other Ambulatory Visit: Payer: Self-pay | Admitting: Internal Medicine

## 2022-03-26 ENCOUNTER — Encounter: Payer: Self-pay | Admitting: Urology

## 2022-03-26 ENCOUNTER — Ambulatory Visit: Payer: Commercial Managed Care - HMO | Admitting: Urology

## 2022-03-26 VITALS — BP 170/101 | HR 74 | Ht 74.0 in | Wt 320.0 lb

## 2022-03-26 DIAGNOSIS — R339 Retention of urine, unspecified: Secondary | ICD-10-CM

## 2022-03-26 DIAGNOSIS — R972 Elevated prostate specific antigen [PSA]: Secondary | ICD-10-CM | POA: Diagnosis not present

## 2022-03-26 DIAGNOSIS — N401 Enlarged prostate with lower urinary tract symptoms: Secondary | ICD-10-CM

## 2022-03-26 LAB — BLADDER SCAN AMB NON-IMAGING: Scan Result: 490

## 2022-03-26 MED ORDER — TELMISARTAN 20 MG PO TABS: 20.0000 mg | ORAL_TABLET | Freq: Every day | ORAL | 0 refills | Status: DC

## 2022-03-26 MED ORDER — ATORVASTATIN CALCIUM 20 MG PO TABS: ORAL_TABLET | ORAL | 0 refills | Status: DC

## 2022-03-26 MED ORDER — TAMSULOSIN HCL 0.4 MG PO CAPS: 0.8000 mg | ORAL_CAPSULE | Freq: Every day | ORAL | 1 refills | Status: DC

## 2022-03-26 NOTE — Progress Notes (Signed)
03/26/2022 8:51 AM   Levi Roberts Oct 13, 1955 [id number]  Referring provider: Dorothyann Peng, MD [address] [place],  [place] [address]  Chief Complaint  Patient presents with   Benign Prostatic Hypertrophy    Urologic history: BPH with LUTS Previously on finasteride which was restarted May 2021 Tamsulosin added 01/2022 for worsening LUTS/PVR 480  2.  Elevated PSA 2 prior negative biopsies; Dr. Vernie Ammons at [facility]  3.  Urethral bleeding Cystoscopy 12/2019 with BPH  4.  Erectile dysfunction On sildenafil   HPI: 66 y.o. male presents for 1 month follow-up.  Refer to my prior note 02/07/2022 He feels his voiding symptoms are much better and that they were aggravated by constipation Tolerating tamsulosin without side effects PSA 03/13/2022 stable 3.4 (uncorrected)    PMH: Past Medical History:  Diagnosis Date   Diabetes mellitus without complication (HCC)    High cholesterol    Hypertension     Surgical History: No past surgical history on file.  Home Medications:  Allergies as of 03/26/2022   No Known Allergies      Medication List        Accurate as of March 26, 2022  8:51 AM. If you have any questions, ask your nurse or doctor.          atorvastatin 20 MG tablet Commonly known as: LIPITOR Take 1 tablet by mouth once daily   finasteride 5 MG tablet Commonly known as: PROSCAR Take 1 tablet (5 mg total) by mouth daily.   Ozempic (2 MG/DOSE) 8 MG/3ML Sopn Generic drug: Semaglutide (2 MG/DOSE) Inject 2 mg into the skin once a week.   tamsulosin 0.4 MG Caps capsule Commonly known as: FLOMAX Take 1 capsule by mouth once daily What changed: Another medication with the same name was added. Make sure you understand how and when to take each.   tamsulosin 0.4 MG Caps capsule Commonly known as: FLOMAX Take 2 capsules (0.8 mg total) by mouth daily. What changed: You were already taking a medication with the same name, and this  prescription was added. Make sure you understand how and when to take each.   telmisartan 20 MG tablet Commonly known as: MICARDIS Take 1 tablet (20 mg total) by mouth daily.   [name] FlexTouch 200 UNIT/ML FlexTouch Pen Generic drug: insulin degludec INJECT 60 UNITS SUBCUTANEOUSLY EVERY DAY AT BEDTIME        Allergies: No Known Allergies  Family History: Family History  Problem Relation Age of Onset   Diabetes Mother    Heart attack Mother    Kidney cancer Father    Diabetes Sister    Healthy Sister     Social History:  reports that he has never smoked. He has never used smokeless tobacco. He reports that he does not drink alcohol and does not use drugs.   Physical Exam: BP (!) 170/101   Pulse 74   Ht 6\' 2"  (1.88 m)   Wt (!) 320 lb (145.2 kg)   BMI 41.09 kg/m   Constitutional:  Alert and oriented, No acute distress. HEENT:  AT Respiratory: Normal respiratory effort, no increased work of breathing. Psychiatric: Normal mood and affect.   Assessment & Plan:    1.  BPH with incomplete bladder emptying Voiding symptoms improved however PVR still elevated 490 mL Declines Foley catheter trial for bladder rest Titrate tamsulosin to 0.8 mg 1 month follow-up for repeat PVR  2.  Elevated PSA Stable    , MD  [facility] [address] [place], [place] [address] [phone]

## 2022-04-10 ENCOUNTER — Other Ambulatory Visit: Payer: Self-pay | Admitting: Nurse Practitioner

## 2022-04-10 DIAGNOSIS — E669 Obesity, unspecified: Secondary | ICD-10-CM

## 2022-04-11 ENCOUNTER — Other Ambulatory Visit: Payer: Self-pay | Admitting: Internal Medicine

## 2022-04-20 ENCOUNTER — Other Ambulatory Visit: Payer: Self-pay | Admitting: Internal Medicine

## 2022-04-22 ENCOUNTER — Other Ambulatory Visit: Payer: Self-pay | Admitting: Internal Medicine

## 2022-04-26 ENCOUNTER — Ambulatory Visit: Payer: Commercial Managed Care - HMO | Admitting: Urology

## 2022-05-01 ENCOUNTER — Ambulatory Visit: Payer: 59 | Admitting: Nurse Practitioner

## 2022-05-07 ENCOUNTER — Encounter: Payer: Self-pay | Admitting: Urology

## 2022-05-07 ENCOUNTER — Ambulatory Visit (INDEPENDENT_AMBULATORY_CARE_PROVIDER_SITE_OTHER): Payer: Commercial Managed Care - HMO | Admitting: Urology

## 2022-05-07 VITALS — BP 152/80 | HR 91 | Ht 74.0 in | Wt 314.0 lb

## 2022-05-07 DIAGNOSIS — R972 Elevated prostate specific antigen [PSA]: Secondary | ICD-10-CM | POA: Diagnosis not present

## 2022-05-07 DIAGNOSIS — N401 Enlarged prostate with lower urinary tract symptoms: Secondary | ICD-10-CM | POA: Diagnosis not present

## 2022-05-07 DIAGNOSIS — R339 Retention of urine, unspecified: Secondary | ICD-10-CM | POA: Diagnosis not present

## 2022-05-07 LAB — BLADDER SCAN AMB NON-IMAGING: Scan Result: 141

## 2022-05-07 MED ORDER — TAMSULOSIN HCL 0.4 MG PO CAPS
0.8000 mg | ORAL_CAPSULE | Freq: Every day | ORAL | 11 refills | Status: DC
Start: 2022-05-07 — End: 2022-08-23

## 2022-05-07 NOTE — Progress Notes (Signed)
   05/07/2022 8:03 AM   Levi Roberts [birth year] [id number]  Referring provider: Glendale Chard, MD [address] [place],  [place] [address]  Chief Complaint  Patient presents with   Benign Prostatic Hypertrophy    Urologic history: BPH with LUTS Previously on finasteride which was restarted May 2021 Tamsulosin added 01/2022 for worsening LUTS/PVR 480  2.  Elevated PSA 2 prior negative biopsies; Dr. Karsten Ro at [facility]  3.  Urethral bleeding Cystoscopy 12/2019 with BPH  4.  Erectile dysfunction On sildenafil   HPI: 66 y.o. male presents for 1 month follow-up.  Refer to my prior note 02/07/2022 PVR at follow-up office visit 03/26/2022 remain persistently elevated at 490 mL and his tamsulosin was titrated to 0.8 mg Tolerating tamsulosin without side effects and he feels he is doing much better.  He is voiding with good stream and has no complaints today. Repeat PVR today was 141 mL PSA 03/13/2022 stable 3.4 (uncorrected)    PMH: Past Medical History:  Diagnosis Date   Diabetes mellitus without complication (HCC)    High cholesterol    Hypertension     Surgical History: History reviewed. No pertinent surgical history.  Home Medications:  Allergies as of 05/07/2022   No Known Allergies      Medication List        Accurate as of May 07, 2022  8:03 AM. If you have any questions, ask your nurse or doctor.          atorvastatin 20 MG tablet Commonly known as: LIPITOR Take 1 tablet by mouth once daily   finasteride 5 MG tablet Commonly known as: PROSCAR Take 1 tablet (5 mg total) by mouth daily.   Ozempic (2 MG/DOSE) 8 MG/3ML Sopn Generic drug: Semaglutide (2 MG/DOSE) INJECT 2 MG INTO THE SKIN ONCE WEEKLY   tamsulosin 0.4 MG Caps capsule Commonly known as: FLOMAX Take 1 capsule by mouth once daily   tamsulosin 0.4 MG Caps capsule Commonly known as: FLOMAX Take 2 capsules (0.8 mg total) by mouth daily.   telmisartan 20 MG  tablet Commonly known as: MICARDIS Take 1 tablet (20 mg total) by mouth daily.   Tyler Aas FlexTouch 200 UNIT/ML FlexTouch Pen Generic drug: insulin degludec INJECT 60 UNITS SUBCUTANEOUSLY EVERY DAY AT BEDTIME        Allergies: No Known Allergies  Family History: Family History  Problem Relation Age of Onset   Diabetes Mother    Heart attack Mother    Kidney cancer Father    Diabetes Sister    Healthy Sister     Social History:  reports that he has never smoked. He has never used smokeless tobacco. He reports that he does not drink alcohol and does not use drugs.   Physical Exam: BP (!) 152/80   Pulse 91   Ht 6\' 2"  (1.88 m)   Wt (!) 314 lb (142.4 kg)   BMI 40.32 kg/m   Constitutional:  Alert and oriented, No acute distress. HEENT: [place] AT Respiratory: Normal respiratory effort, no increased work of breathing. Psychiatric: Normal mood and affect.   Assessment & Plan:    1.  BPH with incomplete bladder emptying PVR improved with titration of tamsulosin to 0.8 mg Refill sent to pharmacy Follow-up 1 year with PVR  2.  Elevated PSA Stable Follow-up 1 year     Abbie Sons, [place] [address] [place], [place] [address] [phone]

## 2022-05-14 ENCOUNTER — Other Ambulatory Visit: Payer: Self-pay

## 2022-05-14 DIAGNOSIS — E669 Obesity, unspecified: Secondary | ICD-10-CM

## 2022-05-14 MED ORDER — TRESIBA FLEXTOUCH 200 UNIT/ML ~~LOC~~ SOPN
PEN_INJECTOR | SUBCUTANEOUS | 0 refills | Status: DC
Start: 2022-05-14 — End: 2022-05-22

## 2022-05-21 ENCOUNTER — Ambulatory Visit (INDEPENDENT_AMBULATORY_CARE_PROVIDER_SITE_OTHER): Payer: Commercial Managed Care - HMO | Admitting: Nurse Practitioner

## 2022-05-21 ENCOUNTER — Encounter: Payer: Self-pay | Admitting: Nurse Practitioner

## 2022-05-21 VITALS — BP 142/70 | HR 91 | Temp 98.0°F | Ht 74.0 in | Wt 319.0 lb

## 2022-05-21 DIAGNOSIS — I10 Essential (primary) hypertension: Secondary | ICD-10-CM

## 2022-05-21 DIAGNOSIS — E1169 Type 2 diabetes mellitus with other specified complication: Secondary | ICD-10-CM | POA: Diagnosis not present

## 2022-05-21 DIAGNOSIS — Z114 Encounter for screening for human immunodeficiency virus [HIV]: Secondary | ICD-10-CM

## 2022-05-21 DIAGNOSIS — Z79899 Other long term (current) drug therapy: Secondary | ICD-10-CM

## 2022-05-21 DIAGNOSIS — Z6841 Body Mass Index (BMI) 40.0 and over, adult: Secondary | ICD-10-CM

## 2022-05-21 DIAGNOSIS — N401 Enlarged prostate with lower urinary tract symptoms: Secondary | ICD-10-CM | POA: Diagnosis not present

## 2022-05-21 DIAGNOSIS — Z0000 Encounter for general adult medical examination without abnormal findings: Secondary | ICD-10-CM

## 2022-05-21 LAB — POCT URINALYSIS DIPSTICK
Bilirubin, UA: NEGATIVE
Glucose, UA: POSITIVE — AB
Ketones, UA: NEGATIVE
Nitrite, UA: NEGATIVE
Protein, UA: NEGATIVE
Spec Grav, UA: 1.02 (ref 1.010–1.025)
Urobilinogen, UA: 1 E.U./dL
pH, UA: 6.5 (ref 5.0–8.0)

## 2022-05-21 NOTE — Patient Instructions (Signed)
Health Maintenance, Male Adopting a healthy lifestyle and getting preventive care are important in promoting health and wellness. Ask your health care provider about: The right schedule for you to have regular tests and exams. Things you can do on your own to prevent diseases and keep yourself healthy. What should I know about diet, weight, and exercise? Eat a healthy diet  Eat a diet that includes plenty of vegetables, fruits, low-fat dairy products, and lean protein. Do not eat a lot of foods that are high in solid fats, added sugars, or sodium. Maintain a healthy weight Body mass index (BMI) is a measurement that can be used to identify possible weight problems. It estimates body fat based on height and weight. Your health care provider can help determine your BMI and help you achieve or maintain a healthy weight. Get regular exercise Get regular exercise. This is one of the most important things you can do for your health. Most adults should: Exercise for at least 150 minutes each week. The exercise should increase your heart rate and make you sweat (moderate-intensity exercise). Do strengthening exercises at least twice a week. This is in addition to the moderate-intensity exercise. Spend less time sitting. Even light physical activity can be beneficial. Watch cholesterol and blood lipids Have your blood tested for lipids and cholesterol at 65 years of age, then have this test every 5 years. You may need to have your cholesterol levels checked more often if: Your lipid or cholesterol levels are high. You are older than 65 years of age. You are at high risk for heart disease. What should I know about cancer screening? Many types of cancers can be detected early and may often be prevented. Depending on your health history and family history, you may need to have cancer screening at various ages. This may include screening for: Colorectal cancer. Prostate cancer. Skin cancer. Lung  cancer. What should I know about heart disease, diabetes, and high blood pressure? Blood pressure and heart disease High blood pressure causes heart disease and increases the risk of stroke. This is more likely to develop in people who have high blood pressure readings or are overweight. Talk with your health care provider about your target blood pressure readings. Have your blood pressure checked: Every 3-5 years if you are 18-39 years of age. Every year if you are 40 years old or older. If you are between the ages of 65 and 75 and are a current or former smoker, ask your health care provider if you should have a one-time screening for abdominal aortic aneurysm (AAA). Diabetes Have regular diabetes screenings. This checks your fasting blood sugar level. Have the screening done: Once every three years after age 45 if you are at a normal weight and have a low risk for diabetes. More often and at a younger age if you are overweight or have a high risk for diabetes. What should I know about preventing infection? Hepatitis B If you have a higher risk for hepatitis B, you should be screened for this virus. Talk with your health care provider to find out if you are at risk for hepatitis B infection. Hepatitis C Blood testing is recommended for: Everyone born from 1945 through 1965. Anyone with known risk factors for hepatitis C. Sexually transmitted infections (STIs) You should be screened each year for STIs, including gonorrhea and chlamydia, if: You are sexually active and are younger than 65 years of age. You are older than 65 years of age and your   health care provider tells you that you are at risk for this type of infection. Your sexual activity has changed since you were last screened, and you are at increased risk for chlamydia or gonorrhea. Ask your health care provider if you are at risk. Ask your health care provider about whether you are at high risk for HIV. Your health care provider  may recommend a prescription medicine to help prevent HIV infection. If you choose to take medicine to prevent HIV, you should first get tested for HIV. You should then be tested every 3 months for as long as you are taking the medicine. Follow these instructions at home: Alcohol use Do not drink alcohol if your health care provider tells you not to drink. If you drink alcohol: Limit how much you have to 0-2 drinks a day. Know how much alcohol is in your drink. In the [place] one drink equals one 12 oz bottle of beer (355 mL), one 5 oz glass of wine (148 mL), or one 1 oz glass of hard liquor (44 mL). Lifestyle Do not use any products that contain nicotine or tobacco. These products include cigarettes, chewing tobacco, and vaping devices, such as e-cigarettes. If you need help quitting, ask your health care provider. Do not use street drugs. Do not share needles. Ask your health care provider for help if you need support or information about quitting drugs. General instructions Schedule regular health, dental, and eye exams. Stay current with your vaccines. Tell your health care provider if: You often feel depressed. You have ever been abused or do not feel safe at home. Summary Adopting a healthy lifestyle and getting preventive care are important in promoting health and wellness. Follow your health care provider's instructions about healthy diet, exercising, and getting tested or screened for diseases. Follow your health care provider's instructions on monitoring your cholesterol and blood pressure. This information is not intended to replace advice given to you by your health care provider. Make sure you discuss any questions you have with your health care provider. Document Revised: 12/26/2020 Document Reviewed: 12/26/2020 Elsevier Patient Education  2023 [employer]  

## 2022-05-21 NOTE — Progress Notes (Signed)
I,Tianna Badgett,acting as a Education administrator for [employer], FNP.,have documented all relevant documentation on the behalf of Minette Brine, FNP,as directed by  Minette Brine, FNP while in the presence of Minette Brine, [place].  Subjective:     Patient ID: Levi Roberts , male    DOB: [birth year] , 66 y.o.   MRN: [id number]   Chief Complaint  Patient presents with   Annual Exam    HPI  He presents today for physical exam. He has seen his Urologist since his last visit, no changes. He has not taken his medications this morning. He will be switching to [facility] in December. He has been taking 25 units of tresiba for 3 weeks. Took his last dose of Ozempic last week.   Wt Readings from Last 3 Encounters: 05/21/22 : (!) 319 lb (144.7 kg) 05/07/22 : (!) 314 lb (142.4 kg) 03/26/22 : (!) 320 lb (145.2 kg)    Diabetes He presents for his follow-up diabetic visit. He has type 2 diabetes mellitus. His disease course has been stable. There are no hypoglycemic associated symptoms. Pertinent negatives for hypoglycemia include no dizziness or headaches. Pertinent negatives for diabetes include no blurred vision, no fatigue, no polydipsia, no polyphagia and no polyuria. There are no hypoglycemic complications. Risk factors for coronary artery disease include diabetes mellitus, dyslipidemia, hypertension, male sex, obesity and sedentary lifestyle. He is compliant with treatment some of the time. He is following a diabetic diet. He participates in exercise intermittently. His breakfast blood glucose is taken between 8-9 am. His breakfast blood glucose range is generally 130-140 mg/dl. (Blood sugar has been as high as 204 since cutting back on his insulin. ) An ACE inhibitor/angiotensin II receptor blocker is being taken.  Hypertension This is a chronic problem. The current episode started more than 1 year ago. The problem has been gradually improving since onset. The problem is controlled. Pertinent negatives  include no blurred vision or headaches. Risk factors for coronary artery disease include diabetes mellitus, dyslipidemia, obesity and sedentary lifestyle. Past treatments include angiotensin blockers. The current treatment provides moderate improvement. Compliance problems include exercise.   Hyperlipidemia This is a chronic problem. The current episode started more than 1 year ago. Exacerbating diseases include diabetes and obesity. Current antihyperlipidemic treatment includes statins. Risk factors for coronary artery disease include diabetes mellitus, dyslipidemia, hypertension and male sex.     Past Medical History:  Diagnosis Date   Diabetes mellitus without complication (HCC)    High cholesterol    Hypertension      Family History  Problem Relation Age of Onset   Diabetes Mother    Heart attack Mother    Kidney cancer Father    Diabetes Sister    Healthy Sister      Current Outpatient Medications:    atorvastatin (LIPITOR) 20 MG tablet, Take 1 tablet by mouth once daily, Disp: 90 tablet, Rfl: 0   Dulaglutide (TRULICITY) 1.5 VW/9.7XY SOPN, Inject 1.5 mg into the skin once a week., Disp: 6 mL, Rfl: 1   finasteride (PROSCAR) 5 MG tablet, Take 1 tablet (5 mg total) by mouth daily., Disp: 90 tablet, Rfl: 3   Insulin Glargine w/ Trans Port (BASAGLAR TEMPO PEN) 100 UNIT/ML SOPN, Inject 60 Units into the skin daily., Disp: 15 mL, Rfl: 1   tamsulosin (FLOMAX) 0.4 MG CAPS capsule, Take 2 capsules (0.8 mg total) by mouth daily., Disp: 60 capsule, Rfl: 11   telmisartan (MICARDIS) 20 MG tablet, Take 1 tablet (20 mg total) by  mouth daily., Disp: 90 tablet, Rfl: 0   No Known Allergies   Men's preventive visit. Patient Health Questionnaire (PHQ-2) is  [place] Office Visit from 05/21/2022 in [facility]  PHQ-2 Total Score 0     Patient is on a Regular diet. Exercise - weekly rides his bike, lift some weights and walking 30-40 minutes almost daily. Marital status:  Married. Relevant history for alcohol use is:  Social History   Substance and Sexual Activity  Alcohol Use Never   Relevant history for tobacco use is:  Social History   Tobacco Use  Smoking Status Never  Smokeless Tobacco Never  .   Review of Systems  Constitutional: Negative.  Negative for fatigue.  HENT: Negative.    Eyes: Negative.  Negative for blurred vision.  Respiratory: Negative.    Cardiovascular: Negative.   Gastrointestinal: Negative.   Endocrine: Negative.  Negative for polydipsia, polyphagia and polyuria.  Genitourinary: Negative.   Musculoskeletal: Negative.   Skin: Negative.   Allergic/Immunologic: Negative.   Neurological: Negative.  Negative for dizziness and headaches.  Hematological: Negative.   Psychiatric/Behavioral: Negative.       Today's Vitals   05/21/22 0845 05/21/22 0934  BP: (!) 158/98 (!) 142/70  Pulse: 91   Temp: 98 F (36.7 C)   TempSrc: Oral   Weight: (!) 319 lb (144.7 kg)   Height: '6\' 2"'  (1.88 m)    Body mass index is 40.96 kg/m.  Wt Readings from Last 3 Encounters:  05/21/22 (!) 319 lb (144.7 kg)  05/07/22 (!) 314 lb (142.4 kg)  03/26/22 (!) 320 lb (145.2 kg)    Objective:  Physical Exam Vitals reviewed.  Constitutional:      General: He is not in acute distress.    Appearance: Normal appearance. He is obese.  HENT:     Head: Normocephalic and atraumatic.     Right Ear: Tympanic membrane, ear canal and external ear normal. There is no impacted cerumen.     Left Ear: Tympanic membrane, ear canal and external ear normal. There is no impacted cerumen.     Nose:     Comments: Deferred - masked     Mouth/Throat:     Comments: Deferred - masked Eyes:     Pupils: Pupils are equal, round, and reactive to light.  Cardiovascular:     Rate and Rhythm: Normal rate and regular rhythm.     Pulses: Normal pulses.     Heart sounds: Normal heart sounds. No murmur heard. Pulmonary:     Effort: Pulmonary effort is normal. No  respiratory distress.     Breath sounds: Normal breath sounds. No wheezing.  Abdominal:     General: Abdomen is flat. Bowel sounds are normal. There is no distension.     Palpations: Abdomen is soft.     Tenderness: There is no abdominal tenderness.  Genitourinary:    Comments: Deferred - followed by Urologist Musculoskeletal:        General: No swelling or tenderness. Normal range of motion.     Cervical back: Normal range of motion and neck supple. No tenderness.  Skin:    General: Skin is warm and dry.     Capillary Refill: Capillary refill takes less than 2 seconds.     Comments: Thickened nails and left great toenail with dark area  Neurological:     General: No focal deficit present.     Mental Status: He is alert and oriented to person, place, and time.  Cranial Nerves: No cranial nerve deficit.     Motor: No weakness.  Psychiatric:        Mood and Affect: Mood normal.        Behavior: Behavior normal.        Thought Content: Thought content normal.        Judgment: Judgment normal.         Assessment And Plan:    1. Encounter for annual physical exam Behavior modifications discussed and diet history reviewed.   Pt will continue to exercise regularly and modify diet with low GI, plant based foods and decrease intake of processed foods.  Recommend intake of daily multivitamin, Vitamin D, and calcium.  Recommend colonoscopy for preventive screenings, as well as recommend immunizations that include influenza, TDAP, and Shingles  2. Screening for HIV without presence of risk factors - HIV Antibody (routine testing w rflx)  3. Class 3 severe obesity due to excess calories with serious comorbidity and body mass index (BMI) of 40.0 to 44.9 in adult [facility]) Chronic Discussed healthy diet and regular exercise options  Encouraged to exercise at least 150 minutes per week with 2 days of strength training  4. Diabetes mellitus type 2 in obese [facility]) Comments: Poorly  controlled, he has been stretching his medication due to insurance changes. Scheduled for diabetic eye exam this week at office. Diabetic foot exam done. I have given a couple of samples until can figure out his plan with his insurance - EKG 12-Lead - Microalbumin / Creatinine Urine Ratio - POCT Urinalysis Dipstick (81002) - Hemoglobin A1c - CMP14+EGFR  5. Essential hypertension Comments: Blood pressure is fairly controlled, repeat BP improved. EKG done with Negative T waves lateral ischemia. HR 80 - CMP14+EGFR - Lipid panel  6. Benign prostatic hyperplasia with lower urinary tract symptoms, symptom details unspecified Comments: Continue current medications and f/u with Urology  7. Other long term (current) drug therapy - CBC    Patient was given opportunity to ask questions. Patient verbalized understanding of the plan and was able to repeat key elements of the plan. All questions were answered to their satisfaction.   Minette Brine, FNP   I, Minette Brine, FNP, have reviewed all documentation for this visit. The documentation on 05/21/22 for the exam, diagnosis, procedures, and orders are all accurate and complete.   THE PATIENT IS ENCOURAGED TO PRACTICE SOCIAL DISTANCING DUE TO THE COVID-19 PANDEMIC.

## 2022-05-22 ENCOUNTER — Other Ambulatory Visit: Payer: Self-pay | Admitting: Nurse Practitioner

## 2022-05-22 DIAGNOSIS — E1169 Type 2 diabetes mellitus with other specified complication: Secondary | ICD-10-CM

## 2022-05-22 LAB — CMP14+EGFR
ALT: 12 IU/L (ref 0–44)
AST: 14 IU/L (ref 0–40)
Albumin/Globulin Ratio: 1.1 — ABNORMAL LOW (ref 1.2–2.2)
Albumin: 4.1 g/dL (ref 3.9–4.9)
Alkaline Phosphatase: 85 IU/L (ref 44–121)
BUN/Creatinine Ratio: 15 (ref 10–24)
BUN: 15 mg/dL (ref 8–27)
Bilirubin Total: 0.5 mg/dL (ref 0.0–1.2)
CO2: 22 mmol/L (ref 20–29)
Calcium: 8.8 mg/dL (ref 8.6–10.2)
Chloride: 99 mmol/L (ref 96–106)
Creatinine, Ser: 1.03 mg/dL (ref 0.76–1.27)
Globulin, Total: 3.6 g/dL (ref 1.5–4.5)
Glucose: 257 mg/dL — ABNORMAL HIGH (ref 70–99)
Potassium: 4.7 mmol/L (ref 3.5–5.2)
Sodium: 136 mmol/L (ref 134–144)
Total Protein: 7.7 g/dL (ref 6.0–8.5)
eGFR: 81 mL/min/{1.73_m2} (ref >59)

## 2022-05-22 LAB — CBC
Hematocrit: 43.9 % (ref 37.5–51.0)
Hemoglobin: 14 g/dL (ref 13.0–17.7)
MCH: 27 pg (ref 26.6–33.0)
MCHC: 31.9 g/dL (ref 31.5–35.7)
MCV: 85 fL (ref 79–97)
Platelets: 232 10*3/uL (ref 150–450)
RBC: 5.18 x10E6/uL (ref 4.14–5.80)
RDW: 13.2 % (ref 11.6–15.4)
WBC: 5.8 10*3/uL (ref 3.4–10.8)

## 2022-05-22 LAB — MICROALBUMIN / CREATININE URINE RATIO
Creatinine, Urine: 93.3 mg/dL
Microalb/Creat Ratio: 28 mg/g creat (ref 0–29)
Microalbumin, Urine: 26 ug/mL

## 2022-05-22 LAB — LIPID PANEL
Chol/HDL Ratio: 3.2 ratio (ref 0.0–5.0)
Cholesterol, Total: 126 mg/dL (ref 100–199)
HDL: 40 mg/dL (ref >39)
LDL Chol Calc (NIH): 71 mg/dL (ref 0–99)
Triglycerides: 77 mg/dL (ref 0–149)
VLDL Cholesterol Cal: 15 mg/dL (ref 5–40)

## 2022-05-22 LAB — HEMOGLOBIN A1C
Est. average glucose Bld gHb Est-mCnc: 292 mg/dL
Hgb A1c MFr Bld: 11.8 % — ABNORMAL HIGH (ref 4.8–5.6)

## 2022-05-22 MED ORDER — TRULICITY 1.5 MG/0.5ML ~~LOC~~ SOAJ: 1.5000 mg | SUBCUTANEOUS | 1 refills | Status: DC

## 2022-05-22 MED ORDER — BASAGLAR TEMPO PEN 100 UNIT/ML ~~LOC~~ SOPN: 60.0000 [IU] | PEN_INJECTOR | Freq: Every day | SUBCUTANEOUS | 1 refills | Status: DC

## 2022-05-23 LAB — HM DIABETES EYE EXAM

## 2022-05-28 ENCOUNTER — Encounter: Payer: Self-pay | Admitting: Internal Medicine

## 2022-07-10 ENCOUNTER — Other Ambulatory Visit: Payer: Self-pay | Admitting: Nurse Practitioner

## 2022-07-10 DIAGNOSIS — E1169 Type 2 diabetes mellitus with other specified complication: Secondary | ICD-10-CM

## 2022-08-01 ENCOUNTER — Ambulatory Visit: Payer: Self-pay

## 2022-08-01 NOTE — Patient Outreach (Signed)
  Care Coordination   Initial Visit Note   08/01/2022 Name: NELS MUNN MRN: [id number] DOB: 1955-10-21  [name] is a 66 y.o. year old male who sees Dorothyann Peng, MD for primary care. I spoke with  [name] by phone today.  What matters to the patients health and wellness today?  Visit Scheduled    Goals Addressed   None     SDOH assessments and interventions completed:  No     Care Coordination Interventions:  No, not indicated   Follow up plan: Follow up call scheduled for 08/01/22    Encounter Outcome:  Pt. Scheduled   Bevelyn Ngo, BSW, CDP Social Worker, Certified Dementia Practitioner [facility] Care Management  Care Coordination [phone]

## 2022-08-02 ENCOUNTER — Ambulatory Visit: Payer: Self-pay

## 2022-08-02 NOTE — Patient Outreach (Signed)
  Care Coordination   Initial Visit Note   08/02/2022 Name: KYLIN DUBS MRN: [id number] DOB: Nov 21, 1955  [name] is a 66 y.o. year old male who sees Dorothyann Peng, MD for primary care. I spoke with  [name] by phone today.  What matters to the patients health and wellness today?  No concerns at this time, doing well.    Goals Addressed             This Visit's Progress    COMPLETED: Care Coordination Activities       Care Coordination Interventions: SDoH screening completed - no acute resource needs identified at this time Determined the patient does not have concerns with medication costs and/or adherence Performed chart review to note patient recent A1C at 11.8 Education provided on the role of the care coordination team - no follow up desired at this time Discussed plans for patient to follow up with primary care provider in March, patient in agreement to engage with RN Care Manager to address disease management if A1C still not at goal following March appointment Collaboration with patients primary care provider Dr. Allyne Gee advising of patients interest to work with RN Care Manager is she feels necessary after March office visit No follow up planned at this time; will await provider referral if indicated          SDOH assessments and interventions completed:  Yes  SDOH Interventions Today    Flowsheet Row Most Recent Value  SDOH Interventions   Food Insecurity Interventions Intervention Not Indicated  Housing Interventions Intervention Not Indicated  Transportation Interventions Intervention Not Indicated  Utilities Interventions Intervention Not Indicated        Care Coordination Interventions:  Yes, provided   Follow up plan: No further intervention required.   Encounter Outcome:  Pt. Visit Completed   Bevelyn Ngo, BSW, CDP Social Worker, Certified Dementia Practitioner [facility] Care Management  Care  Coordination [phone]

## 2022-08-02 NOTE — Patient Instructions (Signed)
Visit Information  Thank you for taking time to visit with me today. Please don't hesitate to contact me if I can be of assistance to you.   Following are the goals we discussed today:   Goals Addressed             This Visit's Progress    COMPLETED: Care Coordination Activities       Care Coordination Interventions: SDoH screening completed - no acute resource needs identified at this time Determined the patient does not have concerns with medication costs and/or adherence Performed chart review to note patient recent A1C at 11.8 Education provided on the role of the care coordination team - no follow up desired at this time Discussed plans for patient to follow up with primary care provider in March, patient in agreement to engage with RN Care Manager to address disease management if A1C still not at goal following March appointment Collaboration with patients primary care provider Dr. Allyne Gee advising of patients interest to work with RN Care Manager is she feels necessary after March office visit No follow up planned at this time; will await provider referral if indicated          If you are experiencing a Mental Health or Behavioral Health Crisis or need someone to talk to, please go to [facility] [address] [place] [phone]) call [phone]  Patient verbalizes understanding of instructions and care plan provided today and agrees to view in MyChart. Active MyChart status and patient understanding of how to access instructions and care plan via MyChart confirmed with patient.     No further follow up required: Please contact your primary care provider as needed.  Bevelyn Ngo, BSW, CDP Social Worker, Certified Dementia Practitioner [facility] Care Management  Care Coordination [phone]

## 2022-08-10 ENCOUNTER — Other Ambulatory Visit: Payer: Self-pay | Admitting: Nurse Practitioner

## 2022-08-10 DIAGNOSIS — E669 Obesity, unspecified: Secondary | ICD-10-CM

## 2022-08-23 ENCOUNTER — Other Ambulatory Visit: Payer: Self-pay | Admitting: Family Medicine

## 2022-08-23 MED ORDER — TAMSULOSIN HCL 0.4 MG PO CAPS: 0.8000 mg | ORAL_CAPSULE | Freq: Every day | ORAL | 11 refills | Status: DC

## 2022-08-27 ENCOUNTER — Other Ambulatory Visit: Payer: Self-pay | Admitting: Nurse Practitioner

## 2022-08-27 DIAGNOSIS — E1169 Type 2 diabetes mellitus with other specified complication: Secondary | ICD-10-CM

## 2022-08-27 MED ORDER — BASAGLAR TEMPO PEN 100 UNIT/ML ~~LOC~~ SOPN: PEN_INJECTOR | SUBCUTANEOUS | 0 refills | Status: DC

## 2022-09-06 ENCOUNTER — Ambulatory Visit: Payer: 59 | Admitting: Internal Medicine

## 2022-09-17 ENCOUNTER — Ambulatory Visit (INDEPENDENT_AMBULATORY_CARE_PROVIDER_SITE_OTHER): Payer: Medicare HMO | Admitting: Internal Medicine

## 2022-09-17 ENCOUNTER — Encounter: Payer: Self-pay | Admitting: Internal Medicine

## 2022-09-17 VITALS — BP 120/72 | HR 92 | Temp 97.4°F | Ht 74.0 in | Wt 324.2 lb

## 2022-09-17 DIAGNOSIS — E669 Obesity, unspecified: Secondary | ICD-10-CM

## 2022-09-17 DIAGNOSIS — I10 Essential (primary) hypertension: Secondary | ICD-10-CM

## 2022-09-17 DIAGNOSIS — E1169 Type 2 diabetes mellitus with other specified complication: Secondary | ICD-10-CM

## 2022-09-17 DIAGNOSIS — Z2821 Immunization not carried out because of patient refusal: Secondary | ICD-10-CM

## 2022-09-17 DIAGNOSIS — Z6841 Body Mass Index (BMI) 40.0 and over, adult: Secondary | ICD-10-CM

## 2022-09-17 LAB — BMP8+EGFR
BUN/Creatinine Ratio: 8 — ABNORMAL LOW (ref 10–24)
BUN: 8 mg/dL (ref 8–27)
CO2: 21 mmol/L (ref 20–29)
Calcium: 8.6 mg/dL (ref 8.6–10.2)
Chloride: 104 mmol/L (ref 96–106)
Creatinine, Ser: 0.96 mg/dL (ref 0.76–1.27)
Glucose: 204 mg/dL — ABNORMAL HIGH (ref 70–99)
Potassium: 4.3 mmol/L (ref 3.5–5.2)
Sodium: 140 mmol/L (ref 134–144)
eGFR: 88 mL/min/{1.73_m2} (ref >59)

## 2022-09-17 LAB — HEMOGLOBIN A1C
Est. average glucose Bld gHb Est-mCnc: 332 mg/dL
Hgb A1c MFr Bld: 13.2 % — ABNORMAL HIGH (ref 4.8–5.6)

## 2022-09-17 MED ORDER — TELMISARTAN 20 MG PO TABS: 20.0000 mg | ORAL_TABLET | Freq: Every day | ORAL | 2 refills | Status: DC

## 2022-09-17 NOTE — Progress Notes (Signed)
Rich Brave [place],acting as a Education administrator for [name], MD.,have documented all relevant documentation on the behalf of [name], MD,as directed by  [name], MD while in the presence of [name], MD.    Subjective:     Patient ID: Levi Roberts , male    DOB: 07/03/1956 , 67 y.o.   MRN: [id number]   Chief Complaint  Patient presents with   Diabetes   Hypertension    HPI  He presents today for DM/HTN f/u. Patient reports compliance with his meds. He was previously on [place], but was switched to [place] by NP due to insurance coverage.  He states he is now back on Ozempic because he had a 90 day supply. He admits his sugars are still elevated, admits that his food choices also contribute to this.  Additionally, he was on Synjardy but stopped due to urinary frequency.   Diabetes He presents for his follow-up diabetic visit. He has type 2 diabetes mellitus. His disease course has been stable. There are no hypoglycemic associated symptoms. Pertinent negatives for hypoglycemia include no dizziness or headaches. Pertinent negatives for diabetes include no blurred vision, no polydipsia, no polyphagia and no polyuria. There are no hypoglycemic complications. Risk factors for coronary artery disease include diabetes mellitus, dyslipidemia, hypertension, male sex, obesity and sedentary lifestyle. He is compliant with treatment some of the time. He is following a diabetic diet. He participates in exercise intermittently. His breakfast blood glucose is taken between 8-9 am. His breakfast blood glucose range is generally 130-140 mg/dl. An ACE inhibitor/angiotensin II receptor blocker is being taken.  Hypertension This is a chronic problem. The current episode started more than 1 year ago. The problem has been gradually improving since onset. The problem is controlled. Pertinent negatives include no blurred vision or headaches. Risk factors for  coronary artery disease include diabetes mellitus, dyslipidemia, obesity and sedentary lifestyle. Past treatments include angiotensin blockers. The current treatment provides moderate improvement. Compliance problems include exercise.   Hyperlipidemia This is a chronic problem. The current episode started more than 1 year ago. Exacerbating diseases include diabetes and obesity. Current antihyperlipidemic treatment includes statins. Risk factors for coronary artery disease include diabetes mellitus, dyslipidemia, hypertension and male sex.     Past Medical History:  Diagnosis Date   Diabetes mellitus without complication (HCC)    High cholesterol    Hypertension      Family History  Problem Relation Age of Onset   Diabetes Mother    Heart attack Mother    Kidney cancer Father    Diabetes Sister    Healthy Sister      Current Outpatient Medications:    atorvastatin (LIPITOR) 20 MG tablet, Take 1 tablet by mouth once daily, Disp: 90 tablet, Rfl: 0   Dulaglutide (TRULICITY) 1.5 WU/1.3KG SOPN, Inject 1.5 mg into the skin once a week., Disp: 6 mL, Rfl: 1   finasteride (PROSCAR) 5 MG tablet, Take 1 tablet (5 mg total) by mouth daily., Disp: 90 tablet, Rfl: 3   Insulin Glargine w/ Trans Port (BASAGLAR TEMPO PEN) 100 UNIT/ML SOPN, INJECT 60 UNITS  ONCE DAILY Strength: 100 UNIT/ML, Disp: 15 mL, Rfl: 0   tamsulosin (FLOMAX) 0.4 MG CAPS capsule, Take 2 capsules (0.8 mg total) by mouth daily., Disp: 60 capsule, Rfl: 11   telmisartan (MICARDIS) 20 MG tablet, Take 1 tablet (20 mg total) by mouth daily., Disp: 90 tablet, Rfl: 2   No Known Allergies  Review of Systems  Constitutional: Negative.   Eyes: Negative.  Negative for blurred vision.  Respiratory: Negative.    Cardiovascular: Negative.   Gastrointestinal: Negative.   Endocrine: Negative for polydipsia, polyphagia and polyuria.  Musculoskeletal: Negative.   Skin: Negative.   Allergic/Immunologic: Negative.   Neurological: Negative.   Negative for dizziness and headaches.  Hematological: Negative.   Psychiatric/Behavioral: Negative.       Today's Vitals   09/17/22 0903  BP: 120/72  Pulse: 92  Temp: (!) 97.4 F (36.3 C)  Weight: (!) 324 lb 3.2 oz (147.1 kg)  Height: 6\' 2"  (1.88 m)  PainSc: 0-No pain   Body mass index is 41.62 kg/m.  Wt Readings from Last 3 Encounters:  09/17/22 (!) 324 lb 3.2 oz (147.1 kg)  05/21/22 (!) 319 lb (144.7 kg)  05/07/22 (!) 314 lb (142.4 kg)     Objective:  Physical Exam Vitals and nursing note reviewed.  Constitutional:      Appearance: Normal appearance. He is obese.  HENT:     Head: Normocephalic and atraumatic.     Nose:     Comments: Masked     Mouth/Throat:     Comments: Masked  Eyes:     Extraocular Movements: Extraocular movements intact.  Cardiovascular:     Rate and Rhythm: Normal rate and regular rhythm.     Heart sounds: Normal heart sounds.  Pulmonary:     Effort: Pulmonary effort is normal.     Breath sounds: Normal breath sounds.  Musculoskeletal:     Cervical back: Normal range of motion.  Skin:    General: Skin is warm.  Neurological:     General: No focal deficit present.     Mental Status: He is alert.  Psychiatric:        Mood and Affect: Mood normal.       Assessment And Plan:     1. Diabetes mellitus type 2 in obese [facility]) Comments: Chronic, uncontrolled. I will check labs as below and adjust meds as needed. I do plan to c/w Ozempic and resume Synjardy. He will likely c/w Basaglar. - BMP8+EGFR - Hemoglobin A1c  2. Essential hypertension Comments: Chronic, well controlled. He will c/w telmisartan 20mg  daily.  3. Class 3 severe obesity due to excess calories with serious comorbidity and body mass index (BMI) of 40.0 to 44.9 in adult [facility]) Comments: BMI 41. He has gained 5 lbs since October 2023. He is encouraged to aim for at least 150 minutes of exercise/week.  4. Pneumococcal vaccination declined  5. Herpes zoster vaccination  declined   Patient was given opportunity to ask questions. Patient verbalized understanding of the plan and was able to repeat key elements of the plan. All questions were answered to their satisfaction.   I, [name], MD, have reviewed all documentation for this visit. The documentation on 09/17/22 for the exam, diagnosis, procedures, and orders are all accurate and complete.   IF YOU HAVE BEEN REFERRED TO A SPECIALIST, IT MAY TAKE 1-2 WEEKS TO SCHEDULE/PROCESS THE REFERRAL. IF YOU HAVE NOT HEARD FROM US/SPECIALIST IN TWO WEEKS, PLEASE GIVE [place] A CALL AT [phone].   THE PATIENT IS ENCOURAGED TO PRACTICE SOCIAL DISTANCING DUE TO THE COVID-19 PANDEMIC.

## 2022-09-17 NOTE — Patient Instructions (Signed)

## 2022-09-22 ENCOUNTER — Other Ambulatory Visit: Payer: Self-pay | Admitting: Internal Medicine

## 2022-09-24 ENCOUNTER — Other Ambulatory Visit: Payer: Self-pay

## 2022-09-24 MED ORDER — SEMAGLUTIDE(0.25 OR 0.5MG/DOS) 2 MG/3ML ~~LOC~~ SOPN: 0.5000 | PEN_INJECTOR | SUBCUTANEOUS | 1 refills | Status: DC

## 2022-09-26 ENCOUNTER — Other Ambulatory Visit: Payer: Self-pay | Admitting: Nurse Practitioner

## 2022-09-26 DIAGNOSIS — E1169 Type 2 diabetes mellitus with other specified complication: Secondary | ICD-10-CM

## 2022-09-26 MED ORDER — BASAGLAR TEMPO PEN 100 UNIT/ML ~~LOC~~ SOPN: PEN_INJECTOR | SUBCUTANEOUS | 0 refills | Status: DC

## 2022-09-27 ENCOUNTER — Other Ambulatory Visit: Payer: Self-pay

## 2022-09-27 MED ORDER — SEMAGLUTIDE(0.25 OR 0.5MG/DOS) 2 MG/3ML ~~LOC~~ SOPN: 0.5000 mg | PEN_INJECTOR | SUBCUTANEOUS | 1 refills | Status: DC

## 2022-09-28 ENCOUNTER — Other Ambulatory Visit: Payer: Self-pay | Admitting: Nurse Practitioner

## 2022-09-28 DIAGNOSIS — E669 Obesity, unspecified: Secondary | ICD-10-CM

## 2022-10-03 ENCOUNTER — Other Ambulatory Visit: Payer: Self-pay

## 2022-10-03 MED ORDER — SEMAGLUTIDE(0.25 OR 0.5MG/DOS) 2 MG/3ML ~~LOC~~ SOPN: 0.5000 mg | PEN_INJECTOR | SUBCUTANEOUS | 1 refills | Status: DC

## 2022-10-08 ENCOUNTER — Other Ambulatory Visit: Payer: Self-pay | Admitting: Urology

## 2022-10-09 ENCOUNTER — Other Ambulatory Visit: Payer: Self-pay

## 2022-10-09 MED ORDER — DEXCOM G7 RECEIVER DEVI: 1 refills | Status: DC

## 2022-10-09 MED ORDER — DEXCOM G7 SENSOR MISC: 2 refills | Status: DC

## 2022-10-19 ENCOUNTER — Other Ambulatory Visit: Payer: Self-pay | Admitting: *Deleted

## 2022-10-19 MED ORDER — FINASTERIDE 5 MG PO TABS: 5.0000 mg | ORAL_TABLET | Freq: Every day | ORAL | 3 refills | Status: DC

## 2022-10-21 ENCOUNTER — Other Ambulatory Visit: Payer: Self-pay | Admitting: Internal Medicine

## 2022-10-21 DIAGNOSIS — E1169 Type 2 diabetes mellitus with other specified complication: Secondary | ICD-10-CM

## 2022-10-29 ENCOUNTER — Ambulatory Visit (INDEPENDENT_AMBULATORY_CARE_PROVIDER_SITE_OTHER): Payer: Medicare HMO | Admitting: Internal Medicine

## 2022-10-29 ENCOUNTER — Encounter: Payer: Self-pay | Admitting: Internal Medicine

## 2022-10-29 VITALS — BP 138/70 | HR 68 | Temp 97.9°F | Ht 74.0 in | Wt 315.8 lb

## 2022-10-29 DIAGNOSIS — Z0000 Encounter for general adult medical examination without abnormal findings: Secondary | ICD-10-CM

## 2022-10-29 DIAGNOSIS — E785 Hyperlipidemia, unspecified: Secondary | ICD-10-CM | POA: Diagnosis not present

## 2022-10-29 DIAGNOSIS — E1169 Type 2 diabetes mellitus with other specified complication: Secondary | ICD-10-CM | POA: Diagnosis not present

## 2022-10-29 DIAGNOSIS — Z6841 Body Mass Index (BMI) 40.0 and over, adult: Secondary | ICD-10-CM | POA: Diagnosis not present

## 2022-10-29 DIAGNOSIS — E669 Obesity, unspecified: Secondary | ICD-10-CM

## 2022-10-29 DIAGNOSIS — H9190 Unspecified hearing loss, unspecified ear: Secondary | ICD-10-CM | POA: Diagnosis not present

## 2022-10-29 DIAGNOSIS — E7800 Pure hypercholesterolemia, unspecified: Secondary | ICD-10-CM

## 2022-10-29 DIAGNOSIS — I10 Essential (primary) hypertension: Secondary | ICD-10-CM

## 2022-10-29 DIAGNOSIS — Z0001 Encounter for general adult medical examination with abnormal findings: Secondary | ICD-10-CM | POA: Diagnosis not present

## 2022-10-29 MED ORDER — BASAGLAR TEMPO PEN 100 UNIT/ML ~~LOC~~ SOPN: PEN_INJECTOR | SUBCUTANEOUS | 5 refills | Status: DC

## 2022-10-29 MED ORDER — ATORVASTATIN CALCIUM 20 MG PO TABS: 20.0000 mg | ORAL_TABLET | Freq: Every day | ORAL | 2 refills | Status: DC

## 2022-10-29 MED ORDER — SEMAGLUTIDE (1 MG/DOSE) 4 MG/3ML ~~LOC~~ SOPN: 1.0000 mg | PEN_INJECTOR | SUBCUTANEOUS | 2 refills | Status: DC

## 2022-10-29 NOTE — Patient Instructions (Signed)
Health Maintenance, Male Adopting a healthy lifestyle and getting preventive care are important in promoting health and wellness. Ask your health care provider about: The right schedule for you to have regular tests and exams. Things you can do on your own to prevent diseases and keep yourself healthy. What should I know about diet, weight, and exercise? Eat a healthy diet  Eat a diet that includes plenty of vegetables, fruits, low-fat dairy products, and lean protein. Do not eat a lot of foods that are high in solid fats, added sugars, or sodium. Maintain a healthy weight Body mass index (BMI) is a measurement that can be used to identify possible weight problems. It estimates body fat based on height and weight. Your health care provider can help determine your BMI and help you achieve or maintain a healthy weight. Get regular exercise Get regular exercise. This is one of the most important things you can do for your health. Most adults should: Exercise for at least 150 minutes each week. The exercise should increase your heart rate and make you sweat (moderate-intensity exercise). Do strengthening exercises at least twice a week. This is in addition to the moderate-intensity exercise. Spend less time sitting. Even light physical activity can be beneficial. Watch cholesterol and blood lipids Have your blood tested for lipids and cholesterol at 66 years of age, then have this test every 5 years. You may need to have your cholesterol levels checked more often if: Your lipid or cholesterol levels are high. You are older than 66 years of age. You are at high risk for heart disease. What should I know about cancer screening? Many types of cancers can be detected early and may often be prevented. Depending on your health history and family history, you may need to have cancer screening at various ages. This may include screening for: Colorectal cancer. Prostate cancer. Skin cancer. Lung  cancer. What should I know about heart disease, diabetes, and high blood pressure? Blood pressure and heart disease High blood pressure causes heart disease and increases the risk of stroke. This is more likely to develop in people who have high blood pressure readings or are overweight. Talk with your health care provider about your target blood pressure readings. Have your blood pressure checked: Every 3-5 years if you are 18-39 years of age. Every year if you are 40 years old or older. If you are between the ages of 65 and 75 and are a current or former smoker, ask your health care provider if you should have a one-time screening for abdominal aortic aneurysm (AAA). Diabetes Have regular diabetes screenings. This checks your fasting blood sugar level. Have the screening done: Once every three years after age 45 if you are at a normal weight and have a low risk for diabetes. More often and at a younger age if you are overweight or have a high risk for diabetes. What should I know about preventing infection? Hepatitis B If you have a higher risk for hepatitis B, you should be screened for this virus. Talk with your health care provider to find out if you are at risk for hepatitis B infection. Hepatitis C Blood testing is recommended for: Everyone born from 1945 through 1965. Anyone with known risk factors for hepatitis C. Sexually transmitted infections (STIs) You should be screened each year for STIs, including gonorrhea and chlamydia, if: You are sexually active and are younger than 66 years of age. You are older than 66 years of age and your   health care provider tells you that you are at risk for this type of infection. Your sexual activity has changed since you were last screened, and you are at increased risk for chlamydia or gonorrhea. Ask your health care provider if you are at risk. Ask your health care provider about whether you are at high risk for HIV. Your health care provider  may recommend a prescription medicine to help prevent HIV infection. If you choose to take medicine to prevent HIV, you should first get tested for HIV. You should then be tested every 3 months for as long as you are taking the medicine. Follow these instructions at home: Alcohol use Do not drink alcohol if your health care provider tells you not to drink. If you drink alcohol: Limit how much you have to 0-2 drinks a day. Know how much alcohol is in your drink. In the [place] one drink equals one 12 oz bottle of beer (355 mL), one 5 oz glass of wine (148 mL), or one 1 oz glass of hard liquor (44 mL). Lifestyle Do not use any products that contain nicotine or tobacco. These products include cigarettes, chewing tobacco, and vaping devices, such as e-cigarettes. If you need help quitting, ask your health care provider. Do not use street drugs. Do not share needles. Ask your health care provider for help if you need support or information about quitting drugs. General instructions Schedule regular health, dental, and eye exams. Stay current with your vaccines. Tell your health care provider if: You often feel depressed. You have ever been abused or do not feel safe at home. Summary Adopting a healthy lifestyle and getting preventive care are important in promoting health and wellness. Follow your health care provider's instructions about healthy diet, exercising, and getting tested or screened for diseases. Follow your health care provider's instructions on monitoring your cholesterol and blood pressure. This information is not intended to replace advice given to you by your health care provider. Make sure you discuss any questions you have with your health care provider. Document Revised: 12/26/2020 Document Reviewed: 12/26/2020 Elsevier Patient Education  2023 [employer] Inc[employer]  

## 2022-10-29 NOTE — Progress Notes (Signed)
Subjective:   Levi Roberts is a 67 y.o. male who presents for a Welcome to Medicare exam.   He presents today for [facility] visit. He reports compliance with meds. He denies having any headaches, chest pain and shortness of breath. He has no specific concerns at this time.    Diabetes He presents for his follow-up diabetic visit. He has type 2 diabetes mellitus. His disease course has been stable. There are no hypoglycemic associated symptoms. Pertinent negatives for hypoglycemia include no dizziness or headaches. Pertinent negatives for diabetes include no blurred vision, no fatigue, no polydipsia, no polyphagia and no polyuria. There are no hypoglycemic complications. Risk factors for coronary artery disease include diabetes mellitus, dyslipidemia, hypertension, male sex, obesity and sedentary lifestyle. He is compliant with treatment some of the time. He is following a diabetic diet. He participates in exercise intermittently. His breakfast blood glucose is taken between 8-9 am. His breakfast blood glucose range is generally 130-140 mg/dl. An ACE inhibitor/angiotensin II receptor blocker is being taken.  Hypertension This is a chronic problem. The current episode started more than 1 year ago. The problem has been gradually improving since onset. The problem is controlled. Pertinent negatives include no blurred vision or headaches. Risk factors for coronary artery disease include diabetes mellitus, dyslipidemia, obesity and sedentary lifestyle. Past treatments include angiotensin blockers. The current treatment provides moderate improvement. Compliance problems include exercise.   Hyperlipidemia This is a chronic problem. The current episode started more than 1 year ago. Exacerbating diseases include diabetes and obesity. Current antihyperlipidemic treatment includes statins. Risk factors for coronary artery disease include diabetes mellitus, dyslipidemia, hypertension and male sex.     Review  of Systems: Review of Systems  Constitutional: Negative.  Negative for fatigue.  HENT: Negative.    Eyes: Negative.  Negative for blurred vision.  Respiratory: Negative.    Cardiovascular: Negative.   Gastrointestinal: Negative.   Genitourinary: Negative.   Musculoskeletal: Negative.   Skin: Negative.   Neurological: Negative.  Negative for dizziness and headaches.  Endo/Heme/Allergies: Negative.  Negative for polydipsia and polyphagia.  Psychiatric/Behavioral: Negative.        Objective:    Today's Vitals   10/29/22 0915  BP: 138/70  Pulse: 68  Temp: 97.9 F (36.6 C)  TempSrc: Oral  Weight: (!) 315 lb 12.8 oz (143.2 kg)  Height: 6\' 2"  (1.88 m)  PainSc: 0-No pain   Body mass index is 40.55 kg/m. Wt Readings from Last 3 Encounters:  10/29/22 (!) 315 lb 12.8 oz (143.2 kg)  09/17/22 (!) 324 lb 3.2 oz (147.1 kg)  05/21/22 (!) 319 lb (144.7 kg)    Medications Outpatient Encounter Medications as of 10/29/2022  Medication Sig   Continuous Blood Gluc Sensor (DEXCOM G7 SENSOR) MISC USE AS DIRECTED TO CHECK BLOOD SUGAR.   finasteride (PROSCAR) 5 MG tablet Take 1 tablet (5 mg total) by mouth daily.   Semaglutide, 1 MG/DOSE, 4 MG/3ML SOPN Inject 1 mg into the skin once a week.   tamsulosin (FLOMAX) 0.4 MG CAPS capsule Take 2 capsules (0.8 mg total) by mouth daily.   telmisartan (MICARDIS) 20 MG tablet Take 1 tablet (20 mg total) by mouth daily.   [DISCONTINUED] atorvastatin (LIPITOR) 20 MG tablet Take 1 tablet by mouth once daily   [DISCONTINUED] Insulin Glargine w/ Trans Port (BASAGLAR TEMPO PEN) 100 UNIT/ML SOPN INJECT 60 UNITS SUBCUTANEOUSLY ONCE DAILY   [DISCONTINUED] Semaglutide-Weight Management 0.25 MG/0.5ML SOAJ Inject 0.25 mg into the skin. ONCE WEEKLY.   atorvastatin (  LIPITOR) 20 MG tablet Take 1 tablet (20 mg total) by mouth daily.   Insulin Glargine w/ Trans Port (BASAGLAR TEMPO PEN) 100 UNIT/ML SOPN INJECT 60 UNITS SUBCUTANEOUSLY ONCE DAILY   [DISCONTINUED]  Continuous Blood Gluc Receiver (DEXCOM G7 RECEIVER) DEVI USE AS DIRECTED TO CHECK BLOOD SUGAR.   [DISCONTINUED] Dulaglutide (TRULICITY) 1.5 0000000 SOPN Inject 1.5 mg into the skin once a week.   [DISCONTINUED] Semaglutide,0.25 or 0.5MG /DOS, 2 MG/3ML SOPN Inject 0.5 mg into the skin once a week.   No facility-administered encounter medications on file as of 10/29/2022.     History: Past Medical History:  Diagnosis Date   Diabetes mellitus without complication (HCC)    High cholesterol    Hypertension    History reviewed. No pertinent surgical history.  Family History  Problem Relation Age of Onset   Diabetes Mother    Heart attack Mother    Kidney cancer Father    Diabetes Sister    Healthy Sister    Social History   Occupational History   Not on file  Tobacco Use   Smoking status: Never   Smokeless tobacco: Never  Vaping Use   Vaping Use: Never used  Substance and Sexual Activity   Alcohol use: Never   Drug use: Never   Sexual activity: Not on file   Tobacco Counseling Counseling given: Not Answered   Immunizations and Health Maintenance Immunization History  Administered Date(s) Administered   PFIZER(Purple Top)SARS-COV-2 Vaccination 11/01/2019, 11/21/2019, 08/06/2020   Tdap 12/17/2018   Health Maintenance Due  Topic Date Due   COVID-19 Vaccine (4 - 2023-24 season) 04/20/2022    Activities of Daily Living    10/29/2022    9:50 AM  In your present state of health, do you have any difficulty performing the following activities:  Hearing? 1  Comment states this is not a new issue.  Vision? 0  Difficulty concentrating or making decisions? 0  Walking or climbing stairs? 0  Dressing or bathing? 0  Doing errands, shopping? 0  Preparing Food and eating ? N  Using the Toilet? N  In the past six months, have you accidently leaked urine? N  Do you have problems with loss of bowel control? N  Managing your Medications? N  Managing your Finances? N   Housekeeping or managing your Housekeeping? N    Physical Exam    Physical Exam Vitals and nursing note reviewed.  Constitutional:      Appearance: Normal appearance. He is obese.  HENT:     Head: Normocephalic and atraumatic.     Right Ear: Tympanic membrane, ear canal and external ear normal.     Left Ear: Ear canal and external ear normal.     Nose:     Comments: Masked    Mouth/Throat:     Comments: Masked  Eyes:     Extraocular Movements: Extraocular movements intact.     Pupils: Pupils are equal, round, and reactive to light.  Cardiovascular:     Rate and Rhythm: Normal rate and regular rhythm.     Pulses: Normal pulses.          Dorsalis pedis pulses are 2+ on the right side and 2+ on the left side.     Heart sounds: Normal heart sounds.  Pulmonary:     Effort: Pulmonary effort is normal.     Breath sounds: Normal breath sounds.  Abdominal:     General: Bowel sounds are normal.     Palpations: Abdomen is  soft.     Comments: Obese, soft  Genitourinary:    Comments: Deferred  Musculoskeletal:        General: Normal range of motion.     Cervical back: Normal range of motion.  Feet:     Right foot:     Protective Sensation: 5 sites tested.  5 sites sensed.     Skin integrity: Dry skin present.     Toenail Condition: Right toenails are abnormally thick.     Left foot:     Protective Sensation: 5 sites tested.  5 sites sensed.     Skin integrity: Dry skin present.     Toenail Condition: Left toenails are abnormally thick.  Skin:    General: Skin is warm and dry.  Neurological:     General: No focal deficit present.     Mental Status: He is alert and oriented to person, place, and time.  Psychiatric:        Mood and Affect: Mood normal.      (optional), or other factors deemed appropriate based on the beneficiary's medical and social history and current clinical standards.  Advanced Directives: Does Patient Have a Medical Advance Directive?: No Would  patient like information on creating a medical advance directive?: Yes (MAU/Ambulatory/Procedural Areas - Information given)    Assessment:    This is a routine wellness  examination for this patient .   Vision/Hearing screen Hearing Screening   500Hz  1000Hz   Right ear  20  Left ear 20 25   Vision Screening   Right eye Left eye Both eyes  Without correction 20/100 20/200 20/200  With correction 20/20 2020 20/20    Dietary issues and exercise activities discussed:  Current Exercise Habits: Home exercise routine, Type of exercise: walking;strength training/weights, Time (Minutes): 60, Frequency (Times/Week): 4, Weekly Exercise (Minutes/Week): 240, Intensity: Moderate, Exercise limited by: None identified   Goals      Weight (lb) < 200 lb (90.7 kg)     He would like to reach 300 lbs or less within the next several months.         Depression Screen    10/29/2022    9:13 AM 10/29/2022    9:08 AM 05/21/2022    8:44 AM 01/30/2021    2:31 PM  PHQ 2/9 Scores  PHQ - 2 Score 0 0 0 0     Fall Risk    10/29/2022    9:08 AM  Fall Risk   Falls in the past year? 0  Number falls in past yr: 0  Injury with Fall? 0  Risk for fall due to : No Fall Risks  Follow up Falls evaluation completed    Cognitive Function        10/29/2022   10:08 AM 10/29/2022    9:53 AM  6CIT Screen  What Year? 0 points 0 points  What month? 0 points 0 points  What time?  0 points  Count back from 20  0 points  Months in reverse  0 points  Repeat phrase  0 points  Total Score  0 points    Patient Care Team: Dorothyann Peng, MD as PCP - General (Internal Medicine)     Plan:   1. Encounter for Medicare annual wellness exam Comments: The Welcome to Medicare annual wellness visit was performed including discussion of advanced directives, assessment of functional status and cognitive function. EKG performed, NSR w/ nonspecific T abnormality. A full exam was also performed. He will rto in  one year  for AWV with [facility] Advisor.  PATIENT IS ADVISED TO GET 30-45 MINUTES REGULAR EXERCISE NO LESS THAN FOUR TO FIVE DAYS PER WEEK - BOTH WEIGHTBEARING EXERCISES AND AEROBIC ARE RECOMMENDED.  PATIENT IS ADVISED TO FOLLOW A HEALTHY DIET WITH AT LEAST SIX FRUITS/VEGGIES PER DAY, DECREASE INTAKE OF RED MEAT, AND TO INCREASE FISH INTAKE TO TWO DAYS PER WEEK.  MEATS/FISH SHOULD NOT BE FRIED, BAKED OR BROILED IS PREFERABLE.  IT IS ALSO IMPORTANT TO CUT BACK ON YOUR SUGAR INTAKE. PLEASE AVOID ANYTHING WITH ADDED SUGAR, CORN SYRUP OR OTHER SWEETENERS. IF YOU MUST USE A SWEETENER, YOU CAN TRY STEVIA. IT IS ALSO IMPORTANT TO AVOID ARTIFICIALLY SWEETENERS AND DIET BEVERAGES. LASTLY, I SUGGEST WEARING SPF 50 SUNSCREEN ON EXPOSED PARTS AND ESPECIALLY WHEN IN THE DIRECT SUNLIGHT FOR AN EXTENDED PERIOD OF TIME.  PLEASE AVOID FAST FOOD RESTAURANTS AND INCREASE YOUR WATER INTAKE. - EKG 12-Lead  2. Dyslipidemia associated with type 2 diabetes mellitus (HCC) Comments: Chronic, diabetic foot exam was performed. I will increase Ozempic to 1mg  weekly. Importance of dietary compliance was stressed to the patient.  I DISCUSSED WITH THE PATIENT AT LENGTH REGARDING THE GOALS OF GLYCEMIC CONTROL AND POSSIBLE LONG-TERM COMPLICATIONS.  I  ALSO STRESSED THE IMPORTANCE OF COMPLIANCE WITH HOME GLUCOSE MONITORING, DIETARY RESTRICTIONS INCLUDING AVOIDANCE OF SUGARY DRINKS/PROCESSED FOODS,  ALONG WITH REGULAR EXERCISE.  I  ALSO STRESSED THE IMPORTANCE OF ANNUAL EYE EXAMS, SELF FOOT CARE AND COMPLIANCE WITH OFFICE VISITS.  - Hemoglobin A1c - Lipid panel - atorvastatin (LIPITOR) 20 MG tablet; Take 1 tablet (20 mg total) by mouth daily.  Dispense: 90 tablet; Refill: 2 - Insulin Glargine w/ Trans Port (BASAGLAR TEMPO PEN) 100 UNIT/ML SOPN; INJECT 60 UNITS SUBCUTANEOUSLY ONCE DAILY  Dispense: 15 mL; Refill: 5 - Semaglutide, 1 MG/DOSE, 4 MG/3ML SOPN; Inject 1 mg into the skin once a week.  Dispense: 9 mL; Refill: 2  3. Essential hypertension Comments:  Chronic, fair control. He will c/w telmisartan 20mg  daily for now. If elevated at next visit, I will increase telmisartan to 40mg  daily. Follow low sodium diet. - BMP8+eGFR  4. Hearing loss, unspecified hearing loss type, unspecified laterality Comments: I will refer him for formal audiometry testing. He is in agreement w/ treatment plan. - Ambulatory referral to Audiology  5. Class 3 severe obesity due to excess calories with serious comorbidity and body mass index (BMI) of 40.0 to 44.9 in adult [facility]) Comments: BMI 40. He is encouraged to aim for at least 150 minutes of exercise/week, while initially striving for BMI<35 to decrease cardiac risk.  I have personally reviewed and noted the following in the patient's chart:   Medical and social history Use of alcohol, tobacco or illicit drugs  Current medications and supplements Functional ability and status Nutritional status Physical activity Advanced directives List of other physicians Hospitalizations, surgeries, and ER visits in previous 12 months Vitals Screenings to include cognitive, depression, and falls Referrals and appointments  In addition, I have reviewed and discussed with patient certain preventive protocols, quality metrics, and best practice recommendations. A written personalized care plan for preventive services as well as general preventive health recommendations were provided to patient.    [name], MD 11/25/2022

## 2022-10-30 LAB — BMP8+EGFR
BUN/Creatinine Ratio: 9 — ABNORMAL LOW (ref 10–24)
BUN: 10 mg/dL (ref 8–27)
CO2: 21 mmol/L (ref 20–29)
Calcium: 8.9 mg/dL (ref 8.6–10.2)
Chloride: 100 mmol/L (ref 96–106)
Creatinine, Ser: 1.06 mg/dL (ref 0.76–1.27)
Glucose: 198 mg/dL — ABNORMAL HIGH (ref 70–99)
Potassium: 4.6 mmol/L (ref 3.5–5.2)
Sodium: 137 mmol/L (ref 134–144)
eGFR: 78 mL/min/{1.73_m2} (ref >59)

## 2022-10-30 LAB — LIPID PANEL
Chol/HDL Ratio: 3.3 ratio (ref 0.0–5.0)
Cholesterol, Total: 110 mg/dL (ref 100–199)
HDL: 33 mg/dL — ABNORMAL LOW (ref >39)
LDL Chol Calc (NIH): 61 mg/dL (ref 0–99)
Triglycerides: 77 mg/dL (ref 0–149)
VLDL Cholesterol Cal: 16 mg/dL (ref 5–40)

## 2022-10-30 LAB — HEMOGLOBIN A1C
Est. average glucose Bld gHb Est-mCnc: 263 mg/dL
Hgb A1c MFr Bld: 10.8 % — ABNORMAL HIGH (ref 4.8–5.6)

## 2022-11-18 DIAGNOSIS — H9190 Unspecified hearing loss, unspecified ear: Secondary | ICD-10-CM | POA: Insufficient documentation

## 2022-12-03 ENCOUNTER — Ambulatory Visit: Payer: Medicare HMO | Attending: Audiologist | Admitting: Audiologist

## 2022-12-03 DIAGNOSIS — H903 Sensorineural hearing loss, bilateral: Secondary | ICD-10-CM | POA: Diagnosis not present

## 2022-12-03 NOTE — Procedures (Signed)
  Outpatient Audiology and [facility] [address] [place], [place]  [address] [phone]  AUDIOLOGICAL  EVALUATION  NAME: BRYKER PULVER     DOB:   December 13, 1955      MRN: [id number]                                                                                     DATE: 12/03/2022     REFERENT: Dorothyann Peng, MD STATUS: Outpatient DIAGNOSIS: Sensorineural Hearing Loss Bilateral     History: Trenidad was seen for an audiological evaluation. Adreian was accompanied to the appointment by his wife. Bam is receiving a hearing evaluation due to concerns for difficulty hearing in crowds. Cleon has difficulty hearing at the mall and large reverberant places. This difficulty began gradually. No pain or pressure reported in either ear. Tinnitus intermittent in both ears ears. Champ has a history of noise exposure from working at [employer] for 40 years.  Medical history positive for diabetes which is a risk factor for hearing loss. No other relevant case history reported.   Evaluation:  Otoscopy showed a clear view of the tympanic membranes, bilaterally Tympanometry results were consistent with normal middle ear function, bilaterally   Audiometric testing was completed using conventional audiometry with supraura; transducer. Speech Recognition Thresholds were 20dB in the right ear and 15dB in the left ear. Word Recognition was performed 40dB SL, scored 100 % in the right ear and 100% in the left ear. Pure tone thresholds show normal hearing sloping to moderate sensorineural hearing loss in both ears.   Results:  The test results were reviewed with Stevan and his wife. Horald has a sloping high frequency sensorineural hearing loss in both ears.  Jamahri is a hearing aid candidate. However he does not feel his loss is impacting him enough at this time to warrant aids. It was recommended he be seen annually, and start thinking about trying aids in the next few years.     Recommendations: 1.   Monitor hearing loss annually due to noise exposure history and diabetes.   36 minutes spent testing and counseling on results.   Ammie Ferrier  Audiologist, Au.D., CCC-A 12/03/2022  8:40 AM  Cc: Dorothyann Peng, MD

## 2022-12-20 ENCOUNTER — Other Ambulatory Visit: Payer: Self-pay

## 2022-12-20 DIAGNOSIS — E1169 Type 2 diabetes mellitus with other specified complication: Secondary | ICD-10-CM

## 2022-12-20 MED ORDER — ATORVASTATIN CALCIUM 20 MG PO TABS: 20.0000 mg | ORAL_TABLET | Freq: Every day | ORAL | 2 refills | Status: DC

## 2023-01-29 ENCOUNTER — Ambulatory Visit (INDEPENDENT_AMBULATORY_CARE_PROVIDER_SITE_OTHER): Payer: Medicare HMO | Admitting: Internal Medicine

## 2023-01-29 VITALS — BP 126/78 | HR 90 | Temp 98.1°F | Ht 74.0 in | Wt 306.6 lb

## 2023-01-29 DIAGNOSIS — I10 Essential (primary) hypertension: Secondary | ICD-10-CM

## 2023-01-29 DIAGNOSIS — E1169 Type 2 diabetes mellitus with other specified complication: Secondary | ICD-10-CM

## 2023-01-29 DIAGNOSIS — E785 Hyperlipidemia, unspecified: Secondary | ICD-10-CM

## 2023-01-29 DIAGNOSIS — Z6839 Body mass index (BMI) 39.0-39.9, adult: Secondary | ICD-10-CM

## 2023-01-29 NOTE — Progress Notes (Signed)
Subjective:  Patient ID: Levi Roberts , male    DOB: 08-20-1956 , 67 y.o.   MRN: [id number]  Chief Complaint  Patient presents with   Diabetes   Hypertension    HPI  He presents today for DM/HTN f/u. Patient reports compliance with his meds. Denies headache, chest pain, SOB. He reports no specific questions or concerns.   Diabetes He presents for his follow-up diabetic visit. He has type 2 diabetes mellitus. His disease course has been stable. There are no hypoglycemic associated symptoms. Pertinent negatives for hypoglycemia include no dizziness or headaches. Pertinent negatives for diabetes include no blurred vision, no polydipsia, no polyphagia and no polyuria. There are no hypoglycemic complications. Risk factors for coronary artery disease include diabetes mellitus, dyslipidemia, hypertension, male sex, obesity and sedentary lifestyle. He is compliant with treatment some of the time. He is following a diabetic diet. He participates in exercise intermittently. His breakfast blood glucose is taken between 8-9 am. His breakfast blood glucose range is generally 130-140 mg/dl. An ACE inhibitor/angiotensin II receptor blocker is being taken.  Hypertension This is a chronic problem. The current episode started more than 1 year ago. The problem has been gradually improving since onset. The problem is controlled. Pertinent negatives include no blurred vision or headaches. Risk factors for coronary artery disease include diabetes mellitus, dyslipidemia, obesity and sedentary lifestyle. Past treatments include angiotensin blockers. The current treatment provides moderate improvement. Compliance problems include exercise.      Past Medical History:  Diagnosis Date   Diabetes mellitus without complication (HCC)    High cholesterol    Hypertension      Family History  Problem Relation Age of Onset   Diabetes Mother    Heart attack Mother    Kidney cancer Father    Diabetes Sister     Healthy Sister      Current Outpatient Medications:    atorvastatin (LIPITOR) 20 MG tablet, Take 1 tablet (20 mg total) by mouth daily., Disp: 90 tablet, Rfl: 2   Continuous Blood Gluc Sensor (DEXCOM G7 SENSOR) MISC, USE AS DIRECTED TO CHECK BLOOD SUGAR., Disp: 1 each, Rfl: 2   finasteride (PROSCAR) 5 MG tablet, Take 1 tablet (5 mg total) by mouth daily., Disp: 90 tablet, Rfl: 3   Insulin Glargine w/ Trans Port (BASAGLAR TEMPO PEN) 100 UNIT/ML SOPN, INJECT 60 UNITS SUBCUTANEOUSLY ONCE DAILY, Disp: 15 mL, Rfl: 5   Semaglutide, 1 MG/DOSE, 4 MG/3ML SOPN, Inject 1 mg into the skin once a week., Disp: 9 mL, Rfl: 2   tamsulosin (FLOMAX) 0.4 MG CAPS capsule, Take 2 capsules (0.8 mg total) by mouth daily., Disp: 60 capsule, Rfl: 11   telmisartan (MICARDIS) 20 MG tablet, Take 1 tablet (20 mg total) by mouth daily., Disp: 90 tablet, Rfl: 2   No Known Allergies   Review of Systems  Constitutional: Negative.   Eyes:  Negative for blurred vision.  Respiratory: Negative.    Cardiovascular: Negative.   Gastrointestinal: Negative.   Endocrine: Negative for polydipsia, polyphagia and polyuria.  Genitourinary: Negative.   Skin: Negative.   Allergic/Immunologic: Negative.   Neurological: Negative.  Negative for dizziness and headaches.  Hematological: Negative.   Psychiatric/Behavioral: Negative.       Today's Vitals   01/29/23 1529  BP: 126/78  Pulse: 90  Temp: 98.1 F (36.7 C)  SpO2: 98%  Weight: (!) 306 lb 9.6 oz (139.1 kg)  Height: 6\' 2"  (1.88 m)   Body mass index is 39.37 kg/m.  Wt Readings from Last 3 Encounters:  01/29/23 (!) 306 lb 9.6 oz (139.1 kg)  10/29/22 (!) 315 lb 12.8 oz (143.2 kg)  09/17/22 (!) 324 lb 3.2 oz (147.1 kg)    The ASCVD Risk score (Arnett DK, et al., 2019) failed to calculate for the following reasons:   The valid total cholesterol range is 130 to 320 mg/dL  Objective:  Physical Exam Vitals and nursing note reviewed.  Constitutional:      Appearance:  Normal appearance.  HENT:     Head: Normocephalic and atraumatic.  Eyes:     Extraocular Movements: Extraocular movements intact.  Cardiovascular:     Rate and Rhythm: Normal rate and regular rhythm.     Heart sounds: Normal heart sounds.  Pulmonary:     Effort: Pulmonary effort is normal.     Breath sounds: Normal breath sounds.  Musculoskeletal:     Cervical back: Normal range of motion.  Skin:    General: Skin is warm.  Neurological:     General: No focal deficit present.     Mental Status: He is alert.  Psychiatric:        Mood and Affect: Mood normal.         Assessment And Plan:  1. Dyslipidemia associated with type 2 diabetes mellitus (HCC) Comments: Chronic, I will check labs as below. He will c/w Ozempic 1mg  weekly and Basaglar 60 units nightly. I will likely need to add SGLT2 inh to get him <8%. - Hemoglobin A1c - TSH - AMB Referral to [facility] Coordinaton (ACO Patients) - CMP14+EGFR - CBC no Diff  2. Essential hypertension Comments: Chronic, fair control. Goal BP<120/80.  He will c/w telmisartan 20mg  daily. Advised to follow low sodium diet. - AMB Referral to [facility] Coordinaton (ACO Patients) - CMP14+EGFR  3. Class 2 severe obesity due to excess calories with serious comorbidity and body mass index (BMI) of 39.0 to 39.9 in adult [facility]) Comments: He has lost 9 lbs since March, I hope this is not due to marked hyperglycemia. - AMB Referral to [facility] Coordinaton (ACO Patients)  Return in 3 months (on 05/01/2023), or dm check.  Patient was given opportunity to ask questions. Patient verbalized understanding of the plan and was able to repeat key elements of the plan. All questions were answered to their satisfaction.   I, [name], MD, have reviewed all documentation for this visit. The documentation on 01/29/23 for the exam, diagnosis, procedures, and orders are all accurate and complete.   IF YOU HAVE BEEN REFERRED TO A SPECIALIST, IT  MAY TAKE 1-2 WEEKS TO SCHEDULE/PROCESS THE REFERRAL. IF YOU HAVE NOT HEARD FROM US/SPECIALIST IN TWO WEEKS, PLEASE GIVE [place] A CALL AT [phone].

## 2023-01-29 NOTE — Patient Instructions (Signed)

## 2023-01-30 ENCOUNTER — Telehealth: Payer: Self-pay

## 2023-01-30 LAB — CBC
Hematocrit: 39 % (ref 37.5–51.0)
Hemoglobin: 12.4 g/dL — ABNORMAL LOW (ref 13.0–17.7)
MCH: 26.3 pg — ABNORMAL LOW (ref 26.6–33.0)
MCHC: 31.8 g/dL (ref 31.5–35.7)
MCV: 83 fL (ref 79–97)
Platelets: 288 10*3/uL (ref 150–450)
RBC: 4.71 x10E6/uL (ref 4.14–5.80)
RDW: 13.3 % (ref 11.6–15.4)
WBC: 4.2 10*3/uL (ref 3.4–10.8)

## 2023-01-30 LAB — CMP14+EGFR
ALT: 10 IU/L (ref 0–44)
AST: 10 IU/L (ref 0–40)
Albumin/Globulin Ratio: 1.1
Albumin: 3.7 g/dL — ABNORMAL LOW (ref 3.9–4.9)
Alkaline Phosphatase: 91 IU/L (ref 44–121)
BUN/Creatinine Ratio: 14 (ref 10–24)
BUN: 14 mg/dL (ref 8–27)
Bilirubin Total: 0.3 mg/dL (ref 0.0–1.2)
CO2: 22 mmol/L (ref 20–29)
Calcium: 9.4 mg/dL (ref 8.6–10.2)
Chloride: 99 mmol/L (ref 96–106)
Creatinine, Ser: 1.03 mg/dL (ref 0.76–1.27)
Globulin, Total: 3.4 g/dL (ref 1.5–4.5)
Glucose: 389 mg/dL — ABNORMAL HIGH (ref 70–99)
Potassium: 4.7 mmol/L (ref 3.5–5.2)
Sodium: 132 mmol/L — ABNORMAL LOW (ref 134–144)
Total Protein: 7.1 g/dL (ref 6.0–8.5)
eGFR: 81 mL/min/{1.73_m2} (ref >59)

## 2023-01-30 LAB — TSH: TSH: 1.42 u[IU]/mL (ref 0.450–4.500)

## 2023-01-30 LAB — HEMOGLOBIN A1C
Est. average glucose Bld gHb Est-mCnc: 321 mg/dL
Hgb A1c MFr Bld: 12.8 % — ABNORMAL HIGH (ref 4.8–5.6)

## 2023-01-30 NOTE — Progress Notes (Signed)
   Care Guide Note  01/30/2023 Name: [name] MRN: [id number] DOB: 05-16-1956  Referred by: Dorothyann Peng, MD Reason for referral : Care Coordination (Outreach to schedule with pharm d in office 01/31/2023 @ 10 am )   Levi Roberts is a 67 y.o. year old male who is a primary care patient of Dorothyann Peng, MD. [name] was referred to the pharmacist for assistance related to DM.    Successful contact was made with the patient to discuss pharmacy services including being ready for the pharmacist to call at least 5 minutes before the scheduled appointment time, to have medication bottles and any blood sugar or blood pressure readings ready for review. The patient agreed to meet with the pharmacist via with the pharmacist via telephone visit on (date/time).  02/11/2023  Penne Lash, RMA Care Guide [facility]  [place], [place] [address] Direct Dial: [phone] [email or web address]

## 2023-01-30 NOTE — Progress Notes (Signed)
   Care Guide Note  01/30/2023 Name: Levi Roberts MRN: [id number] DOB: May 30, 1956  Referred by: Dorothyann Peng, MD Reason for referral : Care Coordination (Outreach to schedule with pharm d in office 01/31/2023 @ 10 am )   Levi Roberts is a 67 y.o. year old male who is a primary care patient of Dorothyann Peng, MD. Levi Roberts was referred to the pharmacist for assistance related to HTN and DM.    An unsuccessful telephone outreach was attempted today to contact the patient who was referred to the pharmacy team for assistance with medication management. Additional attempts will be made to contact the patient.   Penne Lash, RMA Care Guide [facility]  [place], [place] [address] Direct Dial: [phone] [email or web address]

## 2023-02-04 ENCOUNTER — Other Ambulatory Visit: Payer: Self-pay

## 2023-02-04 MED ORDER — OZEMPIC (2 MG/DOSE) 8 MG/3ML ~~LOC~~ SOPN: 2.0000 mg | PEN_INJECTOR | SUBCUTANEOUS | 0 refills | Status: DC

## 2023-02-11 ENCOUNTER — Other Ambulatory Visit: Payer: Medicare HMO | Admitting: Pharmacist

## 2023-02-11 MED ORDER — TRESIBA FLEXTOUCH 100 UNIT/ML ~~LOC~~ SOPN: 60.0000 [IU] | PEN_INJECTOR | Freq: Every day | SUBCUTANEOUS | 2 refills | Status: DC

## 2023-02-11 NOTE — Progress Notes (Signed)
02/11/2023 Name: Levi Roberts MRN: [id number] DOB: 1956-03-26  Chief Complaint  Patient presents with   Medication Management   Diabetes   Hypertension   Hyperlipidemia    AMDREW OBOYLE is a 67 y.o. year old male who presented for a telephone visit.   They were referred to the pharmacist by their PCP for assistance in managing diabetes.    Subjective:  Care Team: Primary Care Provider: Dorothyann Peng, MD ; Next Scheduled Visit: 03/04/23  Medication Access/Adherence  Current Pharmacy:  [facility] - [place], [place] - [address] [place] [address] Phone: [phone] Fax: [phone]  [facility] [address] [place]), [place] - [address] [place]) [place] [address] Phone: [phone] Fax: [phone]   Patient reports affordability concerns with their medications: Yes  Patient reports access/transportation concerns to their pharmacy: No  Patient reports adherence concerns with their medications:  No    [facility] Medication Adherence Questionnaire   02/11/2023  6:29 AM EDT - Ceasar Mons by Patient  During the past 2 weeks, have you missed any doses of your medication(s)? Yes  During the past 2 weeks, have you missed any doses of your medications because you could not afford them? No  During the past 2 weeks, have you missed any doses of your medications because you could not get to the pharmacy? No  During the past 2 weeks, have you missed any doses of your medications because you could not get refills? No  During the past 2 weeks, have you missed any doses of your medication because you do not know what the medication is for? No  During the past 2 weeks, have you missed any doses of your medications because your medications make you feel sick? Yes  During the past 2 weeks, have you missed any doses of your medications because you have too many medications? No  During the past 2  weeks, have you missed any doses of your medications because you forgot to take your medications? No  During the past 2 weeks, have you missed any doses of your medications because you feel fine and do not think you need the medication(s)? No  Have you missed doses of your medications in the past 2 weeks for any other reason not listed above? No     Diabetes:  Current medications: Ozempic 2 mg weekly, Basaglar 60 units daily, Jardiance 25 mg daily - samples, started last Wednesday   Had been off Ozempic about a month, then restarted at 2 mg dose last week. He started that the same day as starting Jardiance 25 mg. Had a lot of burping, then started throwing up - threw up 4 times while at work this last Thursday. Did not take Friday. However, ate some watermelon and still threw up, so believes it is related to the watermelon. Also reports significant increase in glucosuria.   Current glucose readings: fasting 106 on Friday  Patient denies hypoglycemic s/sx including dizziness, shakiness, sweating. Patient denies hyperglycemic symptoms including polyuria, polydipsia, polyphagia, nocturia, neuropathy, blurred vision.  Current medication access support: cannot afford Ozempic at this time  Hypertension:  Current medications: telmisartan 20 mg daily  Hyperlipidemia/ASCVD Risk Reduction  Current lipid lowering medications: atorvastatin 20 mg daily   Objective:  Lab Results  Component Value Date   HGBA1C 12.8 (H) 01/29/2023    Lab Results  Component Value Date   CREATININE 1.03 01/29/2023   BUN  14 01/29/2023   NA 132 (L) 01/29/2023   K 4.7 01/29/2023   CL 99 01/29/2023   CO2 22 01/29/2023    Lab Results  Component Value Date   CHOL 110 10/29/2022   HDL 33 (L) 10/29/2022   LDLCALC 61 10/29/2022   TRIG 77 10/29/2022   CHOLHDL 3.3 10/29/2022    Medications Reviewed Today     Reviewed by Alden Hipp, RPH-CPP (Pharmacist) on 02/11/23 at 1000  Med List Status: <None>    Medication Order Taking? Sig Documenting Provider Last Dose Status Informant  atorvastatin (LIPITOR) 20 MG tablet [id number] Yes Take 1 tablet (20 mg total) by mouth daily. Dorothyann Peng, MD Taking Active   Continuous Blood Gluc Sensor (DEXCOM G7 SENSOR) [place] [id number]  USE AS DIRECTED TO CHECK BLOOD SUGAR. Dorothyann Peng, MD  Active   empagliflozin (JARDIANCE) 25 MG TABS tablet [id number] Yes Take 25 mg by mouth daily. [provider] Taking Active   finasteride (PROSCAR) 5 MG tablet [id number] Yes Take 1 tablet (5 mg total) by mouth daily. Riki Altes, MD Taking Active   Insulin Glargine w/ Trans [facility] TEMPO PEN) 100 UNIT/ML Namon Cirri [id number]  INJECT 60 UNITS SUBCUTANEOUSLY ONCE DAILY Dorothyann Peng, MD  Active   Semaglutide, 2 MG/DOSE, (OZEMPIC, 2 MG/DOSE,) 8 MG/3ML SOPN [id number]  Inject 2 mg into the skin once a week. Dorothyann Peng, MD  Active   tamsulosin [facility]) 0.4 MG CAPS capsule [id number] Yes Take 2 capsules (0.8 mg total) by mouth daily. Stoioff, Verna Czech, MD Taking Active   telmisartan (MICARDIS) 20 MG tablet [id number] Yes Take 1 tablet (20 mg total) by mouth daily. Dorothyann Peng, MD Taking Active               Assessment/Plan:   Diabetes: - Currently uncontrolled - Reviewed long term cardiovascular and renal outcomes of uncontrolled blood sugar - Reviewed goal A1c, goal fasting, and goal 2 hour post prandial glucose - Discussed that GI side effects are likely related to starting Ozempic back at 2 mg after having been off for a month. Recommend to reduce to 1 mg weekly (click up 37 clicks on 2 mg pen) for a few weeks to re-acclimate to the dose. Continue Basaglar 60 units daily. Patient would like to take Jardiance 25 mg every other day to see how he does, I agree with this. Extensive counseling about need to focus on hydration - would worry about risk of eluglycemic DKA with severe dehydration if continued vomiting from Ozempic and significant glucosuria  from Jardiance. - Meets financial criteria for Ozempic patient assistance program through [employer]. Will collaborate with provider, CPhT, and patient to pursue assistance.   Hypertension: - Currently controlled - Recommend to continue current regimen at this time. Will discuss home readings moving forward   Hyperlipidemia/ASCVD Risk Reduction: - Currently controlled.  - Recommend to continue current regimen at this time  Follow Up Plan: phone call in 4 weeks  Catie TClearance Coots, PharmD, BCACP, CPP Clinical Pharmacist [facility] [phone]

## 2023-02-11 NOTE — Patient Instructions (Addendum)
Levi Roberts,   It was great talking to you today!  Back down on Ozempic to 1 mg weekly. You can click up 37 clicks on the 2 mg pen to equal 1 mg.  Continue Basaglar 60 units daily. You can reduce Jardiance to 1 tablet every other day. If you get dehydrated from significant urination with this medication, please give me a call.   We'll start the process for Ozempic patient assistance from the manufacturer, [employer]. We can also get Tresiba insulin, a replacement for Basaglar, free from this manufacturer   Check your blood sugars twice daily:  1) Fasting, first thing in the morning before breakfast and  2) 2 hours after your largest meal.   For a goal A1c of less than 7%, goal fasting readings are less than 130 and goal 2 hour after meal readings are less than 180.    Take care!  Catie Eppie Gibson, PharmD, BCACP, CPP Clinical Pharmacist [facility] [phone]

## 2023-02-12 ENCOUNTER — Telehealth: Payer: Self-pay

## 2023-02-12 NOTE — Telephone Encounter (Signed)
Submitted application for OZEMPIC AND TRESIBA FLEXTOUCH to [employer] for patient assistance PROCESSING.   Phone: [phone]  PLEASE BE ADVISED

## 2023-02-12 NOTE — Telephone Encounter (Signed)
-----   Message from Alden Hipp, RPH-CPP sent at 02/11/2023 10:25 AM EDT ----- Please start process for Ozempic 2 mg and Tresiba U100 60 units daily from [employer]; please fax provider portion to Dr. Allyne Gee. If unable to complete online, please call patient when mailing the patient portion to give him a heads up. We discussed that we will ask for proof of income (2023 tax return) if needed.   Thanks!  Catie

## 2023-02-18 ENCOUNTER — Other Ambulatory Visit (HOSPITAL_COMMUNITY): Payer: Self-pay

## 2023-02-19 NOTE — Telephone Encounter (Signed)
Received notification from [employer] regarding approval for [facility] AND OZEMPIC. Patient assistance APPROVED from 02/18/2023 to 08/20/2023.  Phone: [phone]   LETTER OF APPROVAL HAS BEEN SCANNED IN TO MEDIA OF CHART  PLEASE BE ADVISED PT WILL NEED TO CALL THE NUMBER ABOVE TO CHECK ON SHIPPING STATUS   I DID CALL AND SPOKE TO PT AND I WILL SEND A NOT TO Earleen Reaper  Melanee Spry CPhT Rx Patient Advocate [phone]

## 2023-03-04 ENCOUNTER — Encounter: Payer: Self-pay | Admitting: Internal Medicine

## 2023-03-04 ENCOUNTER — Ambulatory Visit (INDEPENDENT_AMBULATORY_CARE_PROVIDER_SITE_OTHER): Payer: Medicare HMO | Admitting: Internal Medicine

## 2023-03-04 VITALS — BP 130/78 | HR 92 | Temp 97.5°F | Ht 74.0 in | Wt 307.6 lb

## 2023-03-04 DIAGNOSIS — E785 Hyperlipidemia, unspecified: Secondary | ICD-10-CM | POA: Diagnosis not present

## 2023-03-04 DIAGNOSIS — Z6839 Body mass index (BMI) 39.0-39.9, adult: Secondary | ICD-10-CM | POA: Diagnosis not present

## 2023-03-04 DIAGNOSIS — E1169 Type 2 diabetes mellitus with other specified complication: Secondary | ICD-10-CM

## 2023-03-04 NOTE — Progress Notes (Signed)
I,[name], CMA,acting as a Neurosurgeon for [name], MD.,have documented all relevant documentation on the behalf of [name], MD,as directed by  [name], MD while in the presence of [name], MD.  Subjective:  Patient ID: Levi Roberts , male    DOB: March 24, 1956 , 67 y.o.   MRN: [id number]  Chief Complaint  Patient presents with   Diabetes    HPI  Patient presents today for 4 week Jardiance follow up. He reports compliance with medication. He has no specific questions or concerns.  He admits when initially starting medication he mistakenly took [place] together. He reports getting really sick. He reports after that, he spoke with pharmacist. He now takes Jardiance every other day or every 2 days. He has noticed an improvement in his blood sugars.   Diabetes He presents for his follow-up diabetic visit. He has type 2 diabetes mellitus. His disease course has been stable. There are no hypoglycemic associated symptoms. Pertinent negatives for hypoglycemia include no dizziness or headaches. Pertinent negatives for diabetes include no blurred vision, no polydipsia, no polyphagia and no polyuria. There are no hypoglycemic complications. Risk factors for coronary artery disease include diabetes mellitus, dyslipidemia, hypertension, male sex, obesity and sedentary lifestyle. He is compliant with treatment some of the time. He is following a diabetic diet. He participates in exercise intermittently. His breakfast blood glucose is taken between 8-9 am. His breakfast blood glucose range is generally 130-140 mg/dl. An ACE inhibitor/angiotensin II receptor blocker is being taken.  Hypertension This is a chronic problem. The current episode started more than 1 year ago. The problem has been gradually improving since onset. The problem is controlled. Pertinent negatives include no blurred vision or headaches. Risk factors for coronary artery disease include  diabetes mellitus, dyslipidemia, obesity and sedentary lifestyle. Past treatments include angiotensin blockers. The current treatment provides moderate improvement. Compliance problems include exercise.      Past Medical History:  Diagnosis Date   Diabetes mellitus without complication (HCC)    High cholesterol    Hypertension      Family History  Problem Relation Age of Onset   Diabetes Mother    Heart attack Mother    Kidney cancer Father    Diabetes Sister    Healthy Sister      Current Outpatient Medications:    atorvastatin (LIPITOR) 20 MG tablet, Take 1 tablet (20 mg total) by mouth daily., Disp: 90 tablet, Rfl: 2   empagliflozin (JARDIANCE) 25 MG TABS tablet, Take 25 mg by mouth daily., Disp: , Rfl:    finasteride (PROSCAR) 5 MG tablet, Take 1 tablet (5 mg total) by mouth daily., Disp: 90 tablet, Rfl: 3   Insulin Glargine w/ Trans Port (BASAGLAR TEMPO PEN) 100 UNIT/ML SOPN, INJECT 60 UNITS SUBCUTANEOUSLY ONCE DAILY, Disp: 15 mL, Rfl: 5   Semaglutide, 2 MG/DOSE, (OZEMPIC, 2 MG/DOSE,) 8 MG/3ML SOPN, Inject 2 mg into the skin once a week., Disp: 3 mL, Rfl: 0   tamsulosin (FLOMAX) 0.4 MG CAPS capsule, Take 2 capsules (0.8 mg total) by mouth daily., Disp: 60 capsule, Rfl: 11   telmisartan (MICARDIS) 20 MG tablet, Take 1 tablet (20 mg total) by mouth daily., Disp: 90 tablet, Rfl: 2   Continuous Blood Gluc Sensor (DEXCOM G7 SENSOR) MISC, USE AS DIRECTED TO CHECK BLOOD SUGAR. (Patient not taking: Reported on 03/04/2023), Disp: 1 each, Rfl: 2   No Known Allergies   Review of Systems  Constitutional: Negative.  HENT: Negative.    Eyes:  Negative for blurred vision.  Respiratory: Negative.    Cardiovascular: Negative.   Endocrine: Negative.  Negative for polydipsia, polyphagia and polyuria.  Musculoskeletal: Negative.   Skin: Negative.   Allergic/Immunologic: Negative.   Neurological: Negative.  Negative for dizziness and headaches.  Hematological: Negative.      Today's  Vitals   03/04/23 0845  BP: 130/78  Pulse: 92  Temp: (!) 97.5 F (36.4 C)  SpO2: 98%  Weight: (!) 307 lb 9.6 oz (139.5 kg)  Height: 6\' 2"  (1.88 m)   Body mass index is 39.49 kg/m.  Wt Readings from Last 3 Encounters:  03/04/23 (!) 307 lb 9.6 oz (139.5 kg)  01/29/23 (!) 306 lb 9.6 oz (139.1 kg)  10/29/22 (!) 315 lb 12.8 oz (143.2 kg)     Objective:  Physical Exam Vitals and nursing note reviewed.  Constitutional:      Appearance: Normal appearance. He is obese.  HENT:     Head: Normocephalic and atraumatic.  Eyes:     Extraocular Movements: Extraocular movements intact.  Cardiovascular:     Rate and Rhythm: Normal rate and regular rhythm.     Heart sounds: Normal heart sounds.  Pulmonary:     Effort: Pulmonary effort is normal.     Breath sounds: Normal breath sounds.  Musculoskeletal:     Cervical back: Normal range of motion.  Skin:    General: Skin is warm.  Neurological:     General: No focal deficit present.     Mental Status: He is alert.  Psychiatric:        Mood and Affect: Mood normal.         Assessment And Plan:  Dyslipidemia associated with type 2 diabetes mellitus (HCC) Assessment & Plan: Chronic, he is advised that he likely got "sick" due to Ozempic dose increase and not the Jardiance. He states he is doing much better now. He is encouraged to take Jardiance every other day for now. If tolerated, I will increase to M-F dosing, hopefully we will be able to reach daily dosing. I will check f/u BMP today. He is reminded to stay well hydrated while on this medication.   Orders: -     BMP8+eGFR -     Hemoglobin A1c  Class 2 severe obesity due to excess calories with serious comorbidity and body mass index (BMI) of 39.0 to 39.9 in adult [facility]) Assessment & Plan: He is encouraged to initially strive for BMI less than 30 to decrease cardiac risk. He is advised to exercise no less than 150 minutes per week.     He is encouraged to strive for BMI less  than 30 to decrease cardiac risk. Advised to aim for at least 150 minutes of exercise per week.    No follow-ups on file.  Patient was given opportunity to ask questions. Patient verbalized understanding of the plan and was able to repeat key elements of the plan. All questions were answered to their satisfaction.   I, [name], MD, have reviewed all documentation for this visit. The documentation on 03/04/23 for the exam, diagnosis, procedures, and orders are all accurate and complete.   IF YOU HAVE BEEN REFERRED TO A SPECIALIST, IT MAY TAKE 1-2 WEEKS TO SCHEDULE/PROCESS THE REFERRAL. IF YOU HAVE NOT HEARD FROM US/SPECIALIST IN TWO WEEKS, PLEASE GIVE [place] A CALL AT [phone] X [phone].   THE PATIENT IS ENCOURAGED TO PRACTICE SOCIAL DISTANCING DUE TO THE COVID-19 PANDEMIC.

## 2023-03-04 NOTE — Patient Instructions (Signed)

## 2023-03-05 LAB — HEMOGLOBIN A1C
Est. average glucose Bld gHb Est-mCnc: 295 mg/dL
Hgb A1c MFr Bld: 11.9 % — ABNORMAL HIGH (ref 4.8–5.6)

## 2023-03-05 LAB — BMP8+EGFR
BUN/Creatinine Ratio: 12 (ref 10–24)
BUN: 12 mg/dL (ref 8–27)
CO2: 21 mmol/L (ref 20–29)
Calcium: 8.8 mg/dL (ref 8.6–10.2)
Chloride: 103 mmol/L (ref 96–106)
Creatinine, Ser: 1.04 mg/dL (ref 0.76–1.27)
Glucose: 155 mg/dL — ABNORMAL HIGH (ref 70–99)
Potassium: 4.9 mmol/L (ref 3.5–5.2)
Sodium: 137 mmol/L (ref 134–144)
eGFR: 80 mL/min/{1.73_m2} (ref >59)

## 2023-03-07 ENCOUNTER — Telehealth: Payer: Self-pay

## 2023-03-07 NOTE — Telephone Encounter (Signed)
Patient called and notified that patient assistance is here.

## 2023-03-16 NOTE — Assessment & Plan Note (Signed)
Chronic, he is advised that he likely got "sick" due to Ozempic dose increase and not the Jardiance. He states he is doing much better now. He is encouraged to take Jardiance every other day for now. If tolerated, I will increase to M-F dosing, hopefully we will be able to reach daily dosing. I will check f/u BMP today. He is reminded to stay well hydrated while on this medication.

## 2023-03-16 NOTE — Assessment & Plan Note (Signed)
He is encouraged to initially strive for BMI less than 30 to decrease cardiac risk. He is advised to exercise no less than 150 minutes per week.

## 2023-04-01 ENCOUNTER — Other Ambulatory Visit: Payer: Medicare HMO | Admitting: Pharmacist

## 2023-04-01 NOTE — Progress Notes (Signed)
04/01/2023 Name: Levi Roberts MRN: [id number] DOB: 1955-10-19  Chief Complaint  Patient presents with   Medication Management   Diabetes   Hypertension   Hyperlipidemia    KADIAN Levi Roberts is a 67 y.o. year old male who presented for a telephone visit.   They were referred to the pharmacist by their PCP for assistance in managing diabetes, hypertension, and hyperlipidemia.    Subjective:  Care Team: Primary Care Provider: Dorothyann Peng, MD ; Next Scheduled Visit: 06/10/23  Medication Access/Adherence  Current Pharmacy:  [facility] - [place], [place] - [address] [place] [address] Phone: [phone] Fax: [phone]  [facility] [address] [place]), [place] - [address] [place]) [place] [address] Phone: [phone] Fax: [phone]   Patient reports affordability concerns with their medications: No  Patient reports access/transportation concerns to their pharmacy: No  Patient reports adherence concerns with their medications:  No    Diabetes:  Current medications: Ozempic 2 mg weekly, Basaglar 60 units daily, prescribed Jardiance 25 mg daily but has not been taking; has [place] patient assistance to shift  Medications tried in the past: metformin?  Current glucose readings: reports readings in the 100s; had a reading in the 90s but felt fine; mid-day 2-3 pm: ~160-180s  Patient denies hypoglycemic s/sx including dizziness, shakiness, sweating. Patient reports improvement in hyperglycemic symptoms including polyuria, polydipsia, nocturia  Current meal patterns:  - Breakfast: ~9:30; sausage, egg, and cheese on raisin toast; cup of fruit; sprite zero; this has been a change from [place] toast with breakfast potatoes  - Lunch: generally skips - Supper: 5 pm:  - Snacks: days he isn't working; peanut butter crackers; potato chips; fruit; celery and ranch dressing, apples  -  Drinks: water; if he's out, will have a small serving (1/4) of a drink; has cut back on orange juice   Current physical activity: walking; wants to get back into the gym - has a gym membership; training his grandson; occasionally will ride his bike through the neighborhood  Current medication access support: Ozempic and [place] patient assistance from [employer]  Hypertension:  Current medications: telmisartan 20 mg daily  Patient has a validated, automated, upper arm home BP cuff Current blood pressure readings readings: is not checking  Hyperlipidemia/ASCVD Risk Reduction  Current lipid lowering medications: atorvastatin 20 mg daily  Objective:  Lab Results  Component Value Date   HGBA1C 11.9 (H) 03/04/2023    Lab Results  Component Value Date   CREATININE 1.04 03/04/2023   BUN 12 03/04/2023   NA 137 03/04/2023   K 4.9 03/04/2023   CL 103 03/04/2023   CO2 21 03/04/2023    Lab Results  Component Value Date   CHOL 110 10/29/2022   HDL 33 (L) 10/29/2022   LDLCALC 61 10/29/2022   TRIG 77 10/29/2022   CHOLHDL 3.3 10/29/2022    Medications Reviewed Today     Reviewed by Alden Hipp, RPH-CPP (Pharmacist) on 04/01/23 at 0957  Med List Status: <None>   Medication Order Taking? Sig Documenting Provider Last Dose Status Informant  atorvastatin (LIPITOR) 20 MG tablet [id number] Yes Take 1 tablet (20 mg total) by mouth daily. Dorothyann Peng, MD Taking Active   Continuous Blood Gluc Sensor (DEXCOM G7 SENSOR) [place] [id number]  USE AS DIRECTED TO CHECK BLOOD SUGAR.  Patient not taking: Reported on 03/04/2023   Dorothyann Peng, MD  Active   empagliflozin (JARDIANCE) 25  MG TABS tablet [id number]  Take 25 mg by mouth daily. [provider]  Active   finasteride (PROSCAR) 5 MG tablet [id number] Yes Take 1 tablet (5 mg total) by mouth daily. Riki Altes, MD Taking Active   Insulin Glargine w/ Trans Port [facility] TEMPO PEN) 100 UNIT/ML Namon Cirri [id number] Yes INJECT 60  UNITS SUBCUTANEOUSLY ONCE DAILY Dorothyann Peng, MD Taking Active   Semaglutide, 2 MG/DOSE, (OZEMPIC, 2 MG/DOSE,) 8 MG/3ML SOPN [id number] Yes Inject 2 mg into the skin once a week. Dorothyann Peng, MD Taking Active   tamsulosin [facility]) 0.4 MG CAPS capsule [id number] Yes Take 2 capsules (0.8 mg total) by mouth daily. Stoioff, Verna Czech, MD Taking Active   telmisartan (MICARDIS) 20 MG tablet [id number] Yes Take 1 tablet (20 mg total) by mouth daily. Dorothyann Peng, MD Taking Active               Assessment/Plan:   Diabetes: - Currently uncontrolled but improved  - Reviewed long term cardiovascular and renal outcomes of uncontrolled blood sugar - Reviewed goal A1c, goal fasting, and goal 2 hour post prandial glucose - Reviewed dietary modifications including: praised for focus on portion sizes, lean proteins, fibers. Will send handout on portion sizes - Reviewed lifestyle modifications including: setting goal for physical activity; goal of 150 minutes weekly  - Recommend to start Jardiance daily. Discussed symptoms of hypoglycemia, contact me if any symptoms and insulin dose can be reduced.  - Recommend to check glucose twice daily, fasting and 2 hour post prandial.   Hypertension: - Currently controlled - Reviewed appropriate blood pressure monitoring technique and reviewed goal blood pressure. Recommended to check home blood pressure and heart rate periodically. Discussed using Over [employer] Counter benefits to purchase upper arm BP cuff - Recommend to continue current regimen at this time  Hyperlipidemia/ASCVD Risk Reduction: - Currently controlled.  - Recommend to continue current regimen at this time  Follow Up Plan: phone call in ~ 6 weeks  Catie Eppie Gibson, PharmD, BCACP, CPP Clinical Pharmacist [facility] [phone]

## 2023-04-01 NOTE — Patient Instructions (Addendum)
Mr. Lindig,   Keep up the AMAZING work!  Check your blood sugars twice daily:  1) Fasting, first thing in the morning before breakfast and  2) 2 hours after your largest meal.   For a goal A1c of less than 7%, goal fasting readings are less than 130 and goal 2 hour after meal readings are less than 180.    Here is a website with some great nutritional and lifestyle information related to managing diabetes: https://diabeteseducation.novocare.com/   Here is the handout I really like about Healthy Meal Planning: https://diabeteseducation.novocare.com/content/dam/diabetes-patient/novocare-diabeteseducation/pdfs/booklet-planning-healthy-meals.pdf  Check your blood pressure once daily, and any time you have concerning symptoms like headache, chest pain, dizziness, shortness of breath, or vision changes.   Our goal is less than 130/80.  To appropriately check your blood pressure, make sure you do the following:  1) Avoid caffeine, exercise, or tobacco products for 30 minutes before checking. Empty your bladder. 2) Sit with your back supported in a flat-backed chair. Rest your arm on something flat (arm of the chair, table, etc). 3) Sit still with your feet flat on the floor, resting, for at least 5 minutes.  4) Check your blood pressure. Take 1-2 readings.  5) Write down these readings and bring with you to any provider appointments.  Bring your home blood pressure machine with you to a provider's office for accuracy comparison at least once a year.   Make sure you take your blood pressure medications before you come to any office visit, even if you were asked to fast for labs.   I recommend the Om[employer] upper arm blood pressure machine brand.   Please reach out with any questions!  Catie Eppie Gibson, PharmD, BCACP, CPP Clinical Pharmacist [facility] [phone]

## 2023-04-03 ENCOUNTER — Other Ambulatory Visit: Payer: Self-pay | Admitting: Internal Medicine

## 2023-05-08 ENCOUNTER — Encounter: Payer: Self-pay | Admitting: Urology

## 2023-05-08 ENCOUNTER — Ambulatory Visit: Payer: Medicare HMO | Admitting: Urology

## 2023-05-08 VITALS — BP 153/75 | HR 98 | Ht 75.0 in | Wt 308.0 lb

## 2023-05-08 DIAGNOSIS — R339 Retention of urine, unspecified: Secondary | ICD-10-CM

## 2023-05-08 DIAGNOSIS — N529 Male erectile dysfunction, unspecified: Secondary | ICD-10-CM | POA: Diagnosis not present

## 2023-05-08 DIAGNOSIS — N401 Enlarged prostate with lower urinary tract symptoms: Secondary | ICD-10-CM | POA: Diagnosis not present

## 2023-05-08 DIAGNOSIS — R972 Elevated prostate specific antigen [PSA]: Secondary | ICD-10-CM

## 2023-05-08 LAB — BLADDER SCAN AMB NON-IMAGING: Scale: 200

## 2023-05-08 MED ORDER — FINASTERIDE 5 MG PO TABS: 5.0000 mg | ORAL_TABLET | Freq: Every day | ORAL | 3 refills | Status: DC

## 2023-05-08 MED ORDER — TAMSULOSIN HCL 0.4 MG PO CAPS: 0.8000 mg | ORAL_CAPSULE | Freq: Every day | ORAL | 11 refills | Status: DC

## 2023-05-08 NOTE — Progress Notes (Signed)
Albin Fischer Abdulla,acting as a scribe for Riki Altes, MD.,have documented all relevant documentation on the behalf of Riki Altes, MD,as directed by  Riki Altes, MD while in the presence of Riki Altes, MD.  05/08/2023 10:27 AM   Levi Roberts 1956/01/11 [id number]  Referring provider: Dorothyann Peng, MD [address] [place],  [place] [address]  Chief Complaint  Patient presents with   Benign Prostatic Hypertrophy    Urologic history: BPH with LUTS Previously on finasteride which was restarted May 2021 Tamsulosin added 01/2022 for worsening LUTS/PVR 480  2.  Elevated PSA 2 prior negative biopsies; Dr. Vernie Ammons at [facility]  3.  Urethral bleeding Cystoscopy 12/2019 with BPH  4.  Erectile dysfunction On sildenafil   HPI: 67 y.o. male presents for annual follow-up.  Stable LUTS on Tamsulosin/Finasteride Denies dysuria, gross hematuria Denies flank, abdominal or pelvic pain Primay complaint today is erectile dysfunction. He has failed PDE-5 inhibitors, intracavernosal injections, and vaccuum erection devices. We have previously discussed penile implant surgery, though he is not interested in pursuing He states intracavernosal injections were effective, but he had significant penile pain. He is unsure if he was injecting alprostadil or Trimix  PMH: Past Medical History:  Diagnosis Date   Diabetes mellitus without complication (HCC)    High cholesterol    Hypertension    Home Medications:  Allergies as of 05/08/2023   No Known Allergies      Medication List        Accurate as of May 08, 2023 10:27 AM. If you have any questions, ask your nurse or doctor.          atorvastatin 20 MG tablet Commonly known as: LIPITOR Take 1 tablet (20 mg total) by mouth daily.   Basaglar Tempo Pen 100 UNIT/ML Sopn Generic drug: Insulin Glargine w/ Trans Port INJECT 60 UNITS SUBCUTANEOUSLY ONCE DAILY   Dexcom G7 Sensor Misc USE AS  DIRECTED TO CHECK BLOOD SUGAR.   finasteride 5 MG tablet Commonly known as: PROSCAR Take 1 tablet (5 mg total) by mouth daily.   Jardiance 25 MG Tabs tablet Generic drug: empagliflozin Take 25 mg by mouth daily.   Ozempic (2 MG/DOSE) 8 MG/3ML Sopn Generic drug: Semaglutide (2 MG/DOSE) Inject 2 mg into the skin once a week.   tamsulosin 0.4 MG Caps capsule Commonly known as: FLOMAX Take 2 capsules (0.8 mg total) by mouth daily.   telmisartan 20 MG tablet Commonly known as: MICARDIS TAKE 1 TABLET BY MOUTH DAILY.         Family History: Family History  Problem Relation Age of Onset   Diabetes Mother    Heart attack Mother    Kidney cancer Father    Diabetes Sister    Healthy Sister     Social History:  reports that he has never smoked. He has never used smokeless tobacco. He reports that he does not drink alcohol and does not use drugs.   Physical Exam: BP (!) 153/75   Pulse 98   Ht 6\' 3"  (1.905 m)   Wt (!) 308 lb (139.7 kg)   BMI 38.50 kg/m   Constitutional:  Alert and oriented, No acute distress. HEENT: [place] AT Respiratory: Normal respiratory effort, no increased work of breathing. Psychiatric: Normal mood and affect.   Assessment & Plan:    1. BPH with incomplete bladder emptying PVR today stable at 200 mL. Tamsulosin/Finasteride refilled. 1 year follow up with PVR.  2. Elevated PSA  PSA drawn today.  3. Erectile dysfunction If he had penile pain after injection, he may have been injecting Alprostadil. If interested in a trial of Trimix, will send Rx to pharmacy. He was comfortable with injection technique. If he does elect a trial of Trimix, we also discussed priapism, and if he had a prolonged erection lasting longer tha 2 hours, to contact the office. Continue annual follow up.   I have reviewed the above documentation for accuracy and completeness, and I agree with the above.   Riki Altes, MD  [facility] [address] [place], [place] [address] [phone]

## 2023-05-09 LAB — PSA: Prostate Specific Ag, Serum: 5.1 ng/mL — ABNORMAL HIGH (ref 0.0–4.0)

## 2023-05-13 ENCOUNTER — Other Ambulatory Visit: Payer: Medicare HMO | Admitting: Pharmacist

## 2023-05-13 MED ORDER — METFORMIN HCL 500 MG PO TABS: 500.0000 mg | ORAL_TABLET | Freq: Two times a day (BID) | ORAL | Status: DC

## 2023-05-13 MED ORDER — EMPAGLIFLOZIN 25 MG PO TABS: 25.0000 mg | ORAL_TABLET | Freq: Every day | ORAL | 3 refills | Status: DC

## 2023-05-13 NOTE — Progress Notes (Signed)
05/13/2023 Name: Levi Roberts MRN: [id number] DOB: [birth year]  Chief Complaint  Patient presents with   Hypertension   Medication Management   Diabetes    Levi Roberts is a 67 y.o. year old male who presented for a telephone visit.   They were referred to the pharmacist by their PCP for assistance in managing diabetes.    Subjective:  Care Team: Primary Care Provider: Dorothyann Peng, MD ; Next Scheduled Visit: 06/10/23  Medication Access/Adherence  Current Pharmacy:  [facility] - [place], [place] - [address] [place] [address] Phone: [phone] Fax: [phone]  [facility] [address] [place]),  - [address] [place]) [place] [address] Phone: [phone] Fax: [phone]   Patient reports affordability concerns with their medications: No  Patient reports access/transportation concerns to their pharmacy: No  Patient reports adherence concerns with their medications:  No     Diabetes:  Current medications: Ozempic 2 mg weekly, Tresiba 60 units daily, Jardiance 25 mg  Medications tried in the past: metformin - notes today he did not have any side effects, he just stopped when changed from [place] to [place]  Current glucose readings: fasting: 130-140s; 2 hour post prandials; 160-180  Patient denies hypoglycemic s/sx including dizziness, shakiness, sweating.   Physical activity: goes to the gym twice a week (Monday/Tuesday) - does work outs at home on days he works; walks with his wife;   Current medication access support: Ozempic and Tresiba through patient assistance; denies cost concerns with Jardiance   Hypertension:  Current medications: telmisartan 20 mg daily   Patient has a validated, automated, upper arm home BP cuff - but has not checked this week.   Hyperlipidemia/ASCVD Risk Reduction  Current lipid lowering medications: atorvastatin 20 mg  daily   Objective:  Lab Results  Component Value Date   HGBA1C 11.9 (H) 03/04/2023    Lab Results  Component Value Date   CREATININE 1.04 03/04/2023   BUN 12 03/04/2023   NA 137 03/04/2023   K 4.9 03/04/2023   CL 103 03/04/2023   CO2 21 03/04/2023    Lab Results  Component Value Date   CHOL 110 10/29/2022   HDL 33 (L) 10/29/2022   LDLCALC 61 10/29/2022   TRIG 77 10/29/2022   CHOLHDL 3.3 10/29/2022    Medications Reviewed Today     Reviewed by Alden Hipp, RPH-CPP (Pharmacist) on 05/13/23 at [phone]  Med List Status: <None>   Medication Order Taking? Sig Documenting Provider Last Dose Status Informant  atorvastatin (LIPITOR) 20 MG tablet [id number] Yes Take 1 tablet (20 mg total) by mouth daily. Dorothyann Peng, MD Taking Active   Continuous Blood Gluc Sensor (DEXCOM G7 SENSOR) [place] [id number]  USE AS DIRECTED TO CHECK BLOOD SUGAR. Dorothyann Peng, MD  Active   empagliflozin (JARDIANCE) 25 MG TABS tablet [id number] Yes Take 25 mg by mouth daily. [provider] Taking Active   finasteride (PROSCAR) 5 MG tablet [id number] Yes Take 1 tablet (5 mg total) by mouth daily. Stoioff, Verna Czech, MD Taking Active   insulin degludec (TRESIBA FLEXTOUCH) 200 UNIT/ML FlexTouch Pen [id number] Yes Inject 60 Units into the skin daily. [provider] Taking Active   Insulin Glargine w/ Trans Port (BASAGLAR TEMPO PEN) 100 UNIT/ML Namon Cirri [id number] Yes INJECT 60 UNITS SUBCUTANEOUSLY ONCE DAILY Dorothyann Peng, MD Taking Active   Semaglutide, 2 MG/DOSE, (OZEMPIC, 2 MG/DOSE,) 8 MG/3ML SOPN [id number] Yes Inject 2  mg into the skin once a week. Dorothyann Peng, MD Taking Active   tamsulosin [facility]) 0.4 MG CAPS capsule [id number] Yes Take 2 capsules (0.8 mg total) by mouth daily. Riki Altes, MD Taking Active   telmisartan (MICARDIS) 20 MG tablet [id number] Yes TAKE 1 TABLET BY MOUTH DAILY. Dorothyann Peng, MD Taking Active               Assessment/Plan:   Diabetes: -  Currently uncontrolled but improved - Reviewed long term cardiovascular and renal outcomes of uncontrolled blood sugar - Reviewed goal A1c, goal fasting, and goal 2 hour post prandial glucose - Reviewed dietary modifications including: praised for continued nutritional focus - Reviewed lifestyle modifications including: praised for increased physical activity - Recommend to restart metformin at 500 mg twice daily. Reduce Tresiba to 54 units daily (10% reduction). Continue Ozempic 2 mg weekly, Jardiance 25 mg daily. Patient verbalizes understanding.  - Recommend to check glucose continuously using CGM.   Hypertension: - Currently uncontrolled at last office visit.  - Reviewed long term cardiovascular and renal outcomes of uncontrolled blood pressure - Reviewed appropriate blood pressure monitoring technique and reviewed goal blood pressure. Recommended to check home blood pressure and heart rate, document, and provide to me later this week - Recommend to continue current regimen   Hyperlipidemia/ASCVD Risk Reduction: - Currently controlled.  - Recommend to continue current regimen   Follow Up Plan: patient will message me BP reading later this week. PCP in 4 weeks, PharmD in 8 weeks  Catie TClearance Coots, PharmD, BCACP, CPP Clinical Pharmacist [facility] [phone]

## 2023-05-14 ENCOUNTER — Telehealth: Payer: Self-pay | Admitting: Internal Medicine

## 2023-05-14 NOTE — Telephone Encounter (Signed)
PICKED UP TRESIBA U100 5X3ML QUANTITY 5 OZEMPIC2MG  1X3ML QUANTITY 4

## 2023-05-20 ENCOUNTER — Telehealth: Payer: Self-pay | Admitting: Pharmacist

## 2023-05-20 NOTE — Progress Notes (Signed)
Patient appearing on report for [employer] - Hypertension Control report due to last documented ambulatory blood pressure of 153/75 on 05/08/23. Next appointment with PCP is 06/10/23.  Contacted patient to follow up on home blood pressure readings. Left voicemail.   Catie Eppie Gibson, PharmD, BCACP, CPP Clinical Pharmacist [facility] [phone]

## 2023-05-23 ENCOUNTER — Other Ambulatory Visit: Payer: Self-pay | Admitting: Internal Medicine

## 2023-05-23 DIAGNOSIS — E1169 Type 2 diabetes mellitus with other specified complication: Secondary | ICD-10-CM

## 2023-06-03 ENCOUNTER — Encounter: Payer: 59 | Admitting: Internal Medicine

## 2023-06-10 ENCOUNTER — Ambulatory Visit (INDEPENDENT_AMBULATORY_CARE_PROVIDER_SITE_OTHER): Payer: Medicare HMO | Admitting: Internal Medicine

## 2023-06-10 ENCOUNTER — Encounter: Payer: Self-pay | Admitting: Internal Medicine

## 2023-06-10 VITALS — BP 132/84 | HR 95 | Temp 98.1°F | Ht 75.0 in | Wt 316.4 lb

## 2023-06-10 DIAGNOSIS — K5909 Other constipation: Secondary | ICD-10-CM | POA: Diagnosis not present

## 2023-06-10 DIAGNOSIS — E785 Hyperlipidemia, unspecified: Secondary | ICD-10-CM | POA: Diagnosis not present

## 2023-06-10 DIAGNOSIS — E1169 Type 2 diabetes mellitus with other specified complication: Secondary | ICD-10-CM

## 2023-06-10 DIAGNOSIS — Z6839 Body mass index (BMI) 39.0-39.9, adult: Secondary | ICD-10-CM

## 2023-06-10 DIAGNOSIS — D649 Anemia, unspecified: Secondary | ICD-10-CM | POA: Insufficient documentation

## 2023-06-10 DIAGNOSIS — I10 Essential (primary) hypertension: Secondary | ICD-10-CM | POA: Diagnosis not present

## 2023-06-10 DIAGNOSIS — E66812 Obesity, class 2: Secondary | ICD-10-CM | POA: Diagnosis not present

## 2023-06-10 NOTE — Assessment & Plan Note (Signed)
Chronic, LDL goal is less than 70. Previous lipid labs reviewed, LDL at goal. He will continue with atorvastatin 20mg  daily. He was congratulated on his improved blood sugars; however, advised that he will likely need metformin to help him reach optimal glycemic control.  He will continue with Tresiba 50 units nightly, Jardiance 25mg  daily and Ozempic 2mg  weekly. If A1c not less than 8, will add metformin, he is willing to take once daily.

## 2023-06-10 NOTE — Assessment & Plan Note (Signed)
Chronic, fair control. Goal BP<120/80.  He will continue with telmisartan 20mg  daily. Encouraged to follow low sodium diet. May need to add nighttime amlodipine if BP is persistently elevated.

## 2023-06-10 NOTE — Assessment & Plan Note (Signed)
His hemoglobin was low in June 2024. He denies seeing any blood in his stools; however, admits to worsening constipation. I will check CBC, iron panel today. If needed, will give him stool cards to complete.

## 2023-06-10 NOTE — Assessment & Plan Note (Signed)
He has tried OTC meds including Miralax without any improvement in his symptoms. He was given samples of Linzess 72mg  daily, advised to take upon awakening and to wait 30 minutes prior to eating.

## 2023-06-10 NOTE — Assessment & Plan Note (Signed)
He is aware of 9lb weight gain since July 2024. He is encouraged to aim for at least 150 minutes of exercise per week.

## 2023-06-10 NOTE — Patient Instructions (Signed)

## 2023-06-10 NOTE — Progress Notes (Signed)
I,Levi Roberts, CMA,acting as a Neurosurgeon for Levi Aliment, MD.,have documented all relevant documentation on the behalf of Levi Aliment, MD,as directed by  Levi Aliment, MD while in the presence of Levi Aliment, MD.  Subjective:  Patient ID: Levi Roberts , male    DOB: 1955/09/28 , 67 y.o.   MRN: [id number]  Chief Complaint  Patient presents with   Diabetes   Hypertension    HPI  He presents today for DM/HTN f/u. Patient reports compliance with his meds. Denies headache, chest pain, and SOB. He states his sugars have improved. He admits he has not been taking metformin yet, he didn't think he needed it since his sugars have improved.  He reports no specific questions or concerns.   He reports he will complete eye exam tomorrow.   Diabetes He presents for his follow-up diabetic visit. He has type 2 diabetes mellitus. His disease course has been stable. There are no hypoglycemic associated symptoms. Pertinent negatives for hypoglycemia include no dizziness or headaches. Pertinent negatives for diabetes include no blurred vision, no polydipsia, no polyphagia and no polyuria. There are no hypoglycemic complications. Risk factors for coronary artery disease include diabetes mellitus, dyslipidemia, hypertension, male sex, obesity and sedentary lifestyle. He is compliant with treatment some of the time. He is following a diabetic diet. He participates in exercise intermittently. His breakfast blood glucose is taken between 8-9 am. His breakfast blood glucose range is generally 130-140 mg/dl. An ACE inhibitor/angiotensin II receptor blocker is being taken.  Hypertension This is a chronic problem. The current episode started more than 1 year ago. The problem has been gradually improving since onset. The problem is controlled. Pertinent negatives include no blurred vision or headaches. Risk factors for coronary artery disease include diabetes mellitus, dyslipidemia, obesity and  sedentary lifestyle. Past treatments include angiotensin blockers. The current treatment provides moderate improvement. Compliance problems include exercise.      Past Medical History:  Diagnosis Date   Diabetes mellitus without complication (HCC)    High cholesterol    Hypertension      Family History  Problem Relation Age of Onset   Diabetes Mother    Heart attack Mother    Kidney cancer Father    Diabetes Sister    Healthy Sister      Current Outpatient Medications:    atorvastatin (LIPITOR) 20 MG tablet, Take 1 tablet (20 mg total) by mouth daily., Disp: 90 tablet, Rfl: 2   empagliflozin (JARDIANCE) 25 MG TABS tablet, Take 1 tablet (25 mg total) by mouth daily., Disp: 90 tablet, Rfl: 3   finasteride (PROSCAR) 5 MG tablet, Take 1 tablet (5 mg total) by mouth daily., Disp: 90 tablet, Rfl: 3   insulin degludec (TRESIBA FLEXTOUCH) 200 UNIT/ML FlexTouch Pen, Inject 60 Units into the skin daily., Disp: , Rfl:    metFORMIN (GLUCOPHAGE) 500 MG tablet, Take 1 tablet (500 mg total) by mouth 2 (two) times daily with a meal., Disp: , Rfl:    Semaglutide, 2 MG/DOSE, (OZEMPIC, 2 MG/DOSE,) 8 MG/3ML SOPN, Inject 2 mg into the skin once a week., Disp: 3 mL, Rfl: 0   tamsulosin (FLOMAX) 0.4 MG CAPS capsule, Take 2 capsules (0.8 mg total) by mouth daily., Disp: 60 capsule, Rfl: 11   telmisartan (MICARDIS) 20 MG tablet, TAKE 1 TABLET BY MOUTH DAILY., Disp: 90 tablet, Rfl: 3   No Known Allergies   Review of Systems  Constitutional: Negative.   HENT: Negative.  Eyes:  Negative for blurred vision.  Respiratory: Negative.    Cardiovascular: Negative.   Gastrointestinal:  Positive for constipation.  Endocrine: Negative for polydipsia, polyphagia and polyuria.  Skin: Negative.   Allergic/Immunologic: Negative.   Neurological: Negative.  Negative for dizziness and headaches.  Hematological: Negative.      Today's Vitals   06/10/23 0831  BP: 132/84  Pulse: 95  Temp: 98.1 F (36.7 C)   SpO2: 98%  Weight: (!) 316 lb 6.4 oz (143.5 kg)  Height: 6\' 3"  (1.905 m)   Body mass index is 39.55 kg/m.  Wt Readings from Last 3 Encounters:  06/10/23 (!) 316 lb 6.4 oz (143.5 kg)  05/08/23 (!) 308 lb (139.7 kg)  03/04/23 (!) 307 lb 9.6 oz (139.5 kg)     Objective:  Physical Exam Vitals and nursing note reviewed.  Constitutional:      Appearance: Normal appearance. He is obese.  HENT:     Head: Normocephalic and atraumatic.     Mouth/Throat:     Pharynx: No posterior oropharyngeal erythema.  Eyes:     Extraocular Movements: Extraocular movements intact.  Cardiovascular:     Rate and Rhythm: Normal rate and regular rhythm.     Heart sounds: Normal heart sounds.  Pulmonary:     Effort: Pulmonary effort is normal.     Breath sounds: Normal breath sounds.  Abdominal:     General: Bowel sounds are normal.     Palpations: Abdomen is soft.     Comments: Obese, soft  Musculoskeletal:     Cervical back: Normal range of motion.  Skin:    General: Skin is warm.  Neurological:     General: No focal deficit present.     Mental Status: He is alert.  Psychiatric:        Mood and Affect: Mood normal.   c      Assessment And Plan:  Dyslipidemia associated with type 2 diabetes mellitus (HCC) Assessment & Plan: Chronic, LDL goal is less than 70. Previous lipid labs reviewed, LDL at goal. He will continue with atorvastatin 20mg  daily. He was congratulated on his improved blood sugars; however, advised that he will likely need metformin to help him reach optimal glycemic control.  He will continue with Tresiba 50 units nightly, Jardiance 25mg  daily and Ozempic 2mg  weekly. If A1c not less than 8, will add metformin, he is willing to take once daily.   Orders: -     Hemoglobin A1c -     Microalbumin / creatinine urine ratio -     BMP8+eGFR  Essential hypertension Assessment & Plan: Chronic, fair control. Goal BP<120/80.  He will continue with telmisartan 20mg  daily. Encouraged  to follow low sodium diet. May need to add nighttime amlodipine if BP is persistently elevated.    Anemia, unspecified type Assessment & Plan: His hemoglobin was low in June 2024. He denies seeing any blood in his stools; however, admits to worsening constipation. I will check CBC, iron panel today. If needed, will give him stool cards to complete.  Orders: -     CBC -     Iron, TIBC and Ferritin Panel  Chronic constipation Assessment & Plan: He has tried OTC meds including Miralax without any improvement in his symptoms. He was given samples of Linzess 72mg  daily, advised to take upon awakening and to wait 30 minutes prior to eating.    Class 2 severe obesity due to excess calories with serious comorbidity and body mass index (BMI)  of 39.0 to 39.9 in adult [facility]) Assessment & Plan: He is aware of 9lb weight gain since July 2024. He is encouraged to aim for at least 150 minutes of exercise per week.    He is encouraged to strive for BMI less than 30 to decrease cardiac risk. Advised to aim for at least 150 minutes of exercise per week.    Return in 3 months (on 09/10/2023), or dm check.  Patient was given opportunity to ask questions. Patient verbalized understanding of the plan and was able to repeat key elements of the plan. All questions were answered to their satisfaction.    I, Levi Aliment, MD, have reviewed all documentation for this visit. The documentation on 06/10/23 for the exam, diagnosis, procedures, and orders are all accurate and complete.   IF YOU HAVE BEEN REFERRED TO A SPECIALIST, IT MAY TAKE 1-2 WEEKS TO SCHEDULE/PROCESS THE REFERRAL. IF YOU HAVE NOT HEARD FROM US/SPECIALIST IN TWO WEEKS, PLEASE GIVE [place] A CALL AT [phone].   THE PATIENT IS ENCOURAGED TO PRACTICE SOCIAL DISTANCING DUE TO THE COVID-19 PANDEMIC.

## 2023-06-11 DIAGNOSIS — H524 Presbyopia: Secondary | ICD-10-CM | POA: Diagnosis not present

## 2023-06-11 DIAGNOSIS — E119 Type 2 diabetes mellitus without complications: Secondary | ICD-10-CM | POA: Diagnosis not present

## 2023-06-11 LAB — BMP8+EGFR
BUN/Creatinine Ratio: 12 (ref 10–24)
BUN: 13 mg/dL (ref 8–27)
CO2: 23 mmol/L (ref 20–29)
Calcium: 9 mg/dL (ref 8.6–10.2)
Chloride: 102 mmol/L (ref 96–106)
Creatinine, Ser: 1.09 mg/dL (ref 0.76–1.27)
Glucose: 116 mg/dL — ABNORMAL HIGH (ref 70–99)
Potassium: 4.7 mmol/L (ref 3.5–5.2)
Sodium: 139 mmol/L (ref 134–144)
eGFR: 75 mL/min/{1.73_m2} (ref >59)

## 2023-06-11 LAB — CBC
Hematocrit: 43.2 % (ref 37.5–51.0)
Hemoglobin: 13.5 g/dL (ref 13.0–17.7)
MCH: 27.2 pg (ref 26.6–33.0)
MCHC: 31.3 g/dL — ABNORMAL LOW (ref 31.5–35.7)
MCV: 87 fL (ref 79–97)
Platelets: 236 10*3/uL (ref 150–450)
RBC: 4.96 x10E6/uL (ref 4.14–5.80)
RDW: 14.2 % (ref 11.6–15.4)
WBC: 4.7 10*3/uL (ref 3.4–10.8)

## 2023-06-11 LAB — IRON,TIBC AND FERRITIN PANEL
Ferritin: 254 ng/mL (ref 30–400)
Iron Saturation: 18 % (ref 15–55)
Iron: 49 ug/dL (ref 38–169)
Total Iron Binding Capacity: 274 ug/dL (ref 250–450)
UIBC: 225 ug/dL (ref 111–343)

## 2023-06-11 LAB — MICROALBUMIN / CREATININE URINE RATIO
Creatinine, Urine: 48.7 mg/dL
Microalb/Creat Ratio: 59 mg/g{creat} — ABNORMAL HIGH (ref 0–29)
Microalbumin, Urine: 28.8 ug/mL

## 2023-06-11 LAB — HEMOGLOBIN A1C
Est. average glucose Bld gHb Est-mCnc: 209 mg/dL
Hgb A1c MFr Bld: 8.9 % — ABNORMAL HIGH (ref 4.8–5.6)

## 2023-06-26 ENCOUNTER — Other Ambulatory Visit: Payer: Self-pay

## 2023-06-26 DIAGNOSIS — E1169 Type 2 diabetes mellitus with other specified complication: Secondary | ICD-10-CM

## 2023-07-02 LAB — HM DIABETES EYE EXAM

## 2023-07-03 ENCOUNTER — Other Ambulatory Visit: Payer: Self-pay | Admitting: Pharmacist

## 2023-07-03 NOTE — Progress Notes (Addendum)
Pharmacy Medication Assistance Program Note    07/03/2023  Patient ID: Levi Roberts, male  DOB: 02/16/1956, 67 y.o.  MRN:  [id number]     07/03/2023  Outreach Medication Three  Initial Outreach Date (Medication Three) 07/01/2023  Manufacturer Medication Three Novo Nordisk  Nordisk Drugs Pen Needles  Type of Assistance Manufacturer Assistance  Date Application Sent to Patient 07/01/2023  Application Items Requested Application  Date Application Sent to Prescriber 07/01/2023  Name of Prescriber Dorothyann Peng  Anticpated Follow Up Date with Patient/Provider 07/03/2023  Date Application Received From Patient 07/03/2023  Application Items Received From Patient Application  Date Application Received From Provider 07/03/2023  Date Application Submitted to Manufacturer 07/03/2023  Method Application Sent to Manufacturer Fax  Anticipated Follow Up Date With Manufacturer 08/07/2023          07/03/2023  Outreach Medication One  Initial Outreach Date (Medication One) 07/03/2023  Manufacturer Medication One [employer]  Nordisk Drugs Ozempic  Dose of Ozempic 2 mg  Type of Radiographer, therapeutic Assistance  Date Application Sent to Patient 07/01/2023  Application Items Requested Application  Date Application Sent to Prescriber 07/01/2023  Name of Prescriber Allyne Gee  Date Application Received From Patient 07/03/2023  Application Items Received From Patient Application  Date Application Received From Provider 07/03/2023  Date Application Submitted to Manufacturer 07/03/2023  Method Application Sent to Manufacturer Fax           07/03/2023  Outreach Medication Two  Initial Outreach Date (Medication Two) 07/01/2023  Manufacturer Medication Two Novo Nordisk  Nordisk Drugs Tresiba  Dose of Tresiba U 100 60 units  Type of Assistance Manufacturer Assistance  Date Application Sent to Patient 07/01/2023  Date Application Sent to Prescriber 07/01/2023  Name of Prescriber  Dorothyann Peng  Date Application Received From Patient 07/03/2023  Application Items Received From Patient Application  Date Application Received From Provider 07/03/2023  Date Application Submitted to Manufacturer 07/03/2023       Submitted to manufacturer. Will collaborate with tech team for follow up.   Catie Eppie Gibson, PharmD, BCACP, CPP Clinical Pharmacist [facility] [phone]

## 2023-07-14 NOTE — Progress Notes (Unsigned)
07/15/2023 Name: Levi Roberts MRN: [id number] DOB: 1956/04/03  Chief Complaint  Patient presents with   Diabetes   Hypertension    Levi Roberts is a 67 y.o. year old male who presented for a telephone visit.   They were referred to the pharmacist by their PCP for assistance in managing diabetes.    Subjective:  Care Team: Primary Care Provider: Dorothyann Peng, MD ; Next Scheduled Visit: 11/05/2023  Medication Access/Adherence  Current Pharmacy:  [facility] - [place], [place] - [address] [place] [address] Phone: [phone] Fax: [phone]  [facility] [address] [place]), [place] - [address] [place] [address] Phone: [phone] Fax: [phone]   Diabetes:  Current medications: metformin 500 mg twice daily, Tresiba 60 units daily, Ozempic 2 mg weekly, Jardiance 25 mg daily  No longer using Dexcom due to cost. Only using glucose meter currently.   Current glucose readings:  - AM: 86-104; highest 145  - 1 pm: 110-120  Using blood glucose meter; testing 1-2 times daily  Patient denies hypoglycemic s/sx including dizziness, shakiness, sweating. Patient denies hyperglycemic symptoms including polyuria, polydipsia, polyphagia, nocturia, neuropathy, blurred vision.  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  Patient denies hypertensive symptoms including headache,  chest pain, shortness of breath  Hyperlipidemia/ASCVD Risk Reduction  Current lipid lowering medications: atorvastatin 20 mg daily   ASCVD History: none Family History: diabetes (mother), heart attack (mother) Risk Factors: HTN, DM, obesity   The ASCVD Risk score (Arnett DK, et al., 2019) failed to calculate for the following reasons:   The valid total cholesterol range is 130 to 320 mg/dL    Objective:  Lab Results  Component Value Date   HGBA1C 8.9 (H) 06/10/2023    Lab Results  Component Value Date   CREATININE 1.09 06/10/2023   BUN 13 06/10/2023   NA 139 06/10/2023   K 4.7 06/10/2023   CL 102 06/10/2023   CO2 23 06/10/2023    Lab Results  Component Value Date   CHOL 110 10/29/2022   HDL 33 (L) 10/29/2022   LDLCALC 61 10/29/2022   TRIG 77 10/29/2022   CHOLHDL 3.3 10/29/2022    Medications Reviewed Today     Reviewed by Roslyn Smiling, [facility] (Pharmacist) on 07/15/23 at 0908  Med List Status: <None>   Medication Order Taking? Sig Documenting Provider Last Dose Status Informant  atorvastatin (LIPITOR) 20 MG tablet [id number] Yes Take 1 tablet (20 mg total) by mouth daily. Dorothyann Peng, MD Taking Active   empagliflozin (JARDIANCE) 25 MG TABS tablet [id number] Yes Take 1 tablet (25 mg total) by mouth daily. Dorothyann Peng, MD Taking Active   finasteride (PROSCAR) 5 MG tablet [id number] Yes Take 1 tablet (5 mg total) by mouth daily. Stoioff, Verna Czech, MD Taking Active   insulin degludec (TRESIBA FLEXTOUCH) 200 UNIT/ML FlexTouch Pen [id number] Yes Inject 60 Units into the skin daily. [provider] Taking Active   metFORMIN (GLUCOPHAGE) 500 MG  tablet [id number] Yes Take 1 tablet (500 mg total) by mouth 2 (two) times daily with a meal. Dorothyann Peng, MD Taking Active   Semaglutide, 2 MG/DOSE, (OZEMPIC, 2 MG/DOSE,) 8 MG/3ML SOPN [id number] Yes Inject 2 mg into the skin once a week. Dorothyann Peng, MD Taking Active   tamsulosin [facility]) 0.4 MG CAPS capsule [id number] Yes  Take 2 capsules (0.8 mg total) by mouth daily. Riki Altes, MD Taking Active   telmisartan (MICARDIS) 20 MG tablet [id number] Yes TAKE 1 TABLET BY MOUTH DAILY. Dorothyann Peng, MD Taking Active             Assessment/Plan:   Diabetes: - Currently uncontrolled but improving based on most recent A1c  - Reviewed long term cardiovascular and renal outcomes of uncontrolled blood sugar - Reviewed goal A1c, goal fasting, and goal 2 hour post prandial glucose - Reviewed dietary modifications including increasing fiber intake for constipation reports  - Recommend to continue current regimen   - Can consider decreasing [name] if blood glucose levels remain in control and A1c decreases further. Will continue current regimen for now, but will plan to increase metformin moving forward as able - Recommend to check glucose twice daily  Hypertension: - Currently uncontrolled based on last office BP reading of 132/84 mmHg (goal <130/80 mmHg) - Reviewed long term cardiovascular and renal outcomes of uncontrolled blood pressure - Reviewed appropriate blood pressure monitoring technique and reviewed goal blood pressure. Recommended to check home blood pressure and heart rate a few times a week - Asked patient to have some some BP readings for next visit  - Recommend to continue current regimen    Hyperlipidemia/ASCVD Risk Reduction: - Currently controlled (LDL goal <70) - Reviewed long term complications of uncontrolled cholesterol - Recommend to continue atorvastatin 20 mg daily    Constipation:  - Patient reports longstanding constipation (since about 5 years ago) - Patient doesn't believe Ozempic has made it worse  - Recommending increasing fiber intake through food or with supplements  - Also recommended OTC Miralax if increasing fiber intake doesn't help   Follow Up Plan: January with pharmacist via telephone  Roslyn Smiling, PharmD PGY1 Pharmacy Resident 07/15/2023 11:06 AM   I have  reviewed the pharmacist's encounter and agree with their documentation.   Catie Eppie Gibson, PharmD, BCACP, CPP [facility] [phone]

## 2023-07-15 ENCOUNTER — Other Ambulatory Visit: Payer: Medicare HMO | Admitting: Pharmacist

## 2023-07-15 DIAGNOSIS — I10 Essential (primary) hypertension: Secondary | ICD-10-CM

## 2023-07-15 DIAGNOSIS — I152 Hypertension secondary to endocrine disorders: Secondary | ICD-10-CM

## 2023-07-15 DIAGNOSIS — E785 Hyperlipidemia, unspecified: Secondary | ICD-10-CM

## 2023-07-15 DIAGNOSIS — E1169 Type 2 diabetes mellitus with other specified complication: Secondary | ICD-10-CM

## 2023-07-15 NOTE — Patient Instructions (Signed)
Hi Mr. Tomson,   It was great speaking with you today!  Continue your current medication regimen. Try to increase your fiber intake like we discussed today.   Check your blood pressure periodically, and any time you have concerning symptoms like headache, chest pain, dizziness, shortness of breath, or vision changes.   Our goal is less than 130/80.  To appropriately check your blood pressure, make sure you do the following:  1) Avoid caffeine, exercise, or tobacco products for 30 minutes before checking. Empty your bladder. 2) Sit with your back supported in a flat-backed chair. Rest your arm on something flat (arm of the chair, table, etc). 3) Sit still with your feet flat on the floor, resting, for at least 5 minutes.  4) Check your blood pressure. Take 1-2 readings.  5) Write down these readings and bring with you to any provider appointments.  Bring your home blood pressure machine with you to a provider's office for accuracy comparison at least once a year.   Make sure you take your blood pressure medications before you come to any office visit, even if you were asked to fast for labs.   Take care, Roslyn Smiling, PharmD

## 2023-07-26 ENCOUNTER — Other Ambulatory Visit: Payer: Self-pay | Admitting: Pharmacist

## 2023-07-26 NOTE — Progress Notes (Signed)
Pharmacy Medication Assistance Program Note    07/26/2023  Patient ID: Levi Roberts, male   DOB: May 18, 1956, 67 y.o.   MRN: [id number]     07/03/2023 07/26/2023  Outreach Medication One  Initial Outreach Date (Medication One) 07/03/2023   Manufacturer Medication One [employer] Drugs Ozempic   Dose of Ozempic 2 mg   Type of Counsellor Assistance  Date Application Sent to Patient 07/01/2023 07/01/2023  Application Items Requested Application Application  Date Application Sent to Prescriber 07/01/2023 07/01/2023  Name of Prescriber Marlowe Alt  Date Application Received From Patient 07/03/2023   Application Items Received From Patient Application   Date Application Received From Provider 07/03/2023 07/03/2023   07/03/2023  Date Application Submitted to Manufacturer 07/03/2023 07/03/2023   07/03/2023  Method Application Sent to Manufacturer Fax Fax   Fax  Patient Assistance Determination  Approved  Approval Start Date  08/21/2023   08/21/2023  Approval End Date  08/19/2024   08/19/2024  Patient Notification Method  MyChart   MyChart     Multiple values from one day are sorted in reverse-chronological order        07/03/2023 07/26/2023  Outreach Medication Two  Initial Outreach Date (Medication Two) 07/01/2023   Manufacturer Medication Two Novo Nordisk   Nordisk Drugs [name]   Dose of Tresiba U 100 60 units   Type of Forensic scientist Assistance  Date Application Sent to Patient 07/01/2023   Date Application Sent to Prescriber 07/01/2023   Name of Prescriber Dorothyann Peng   Date Application Received From Patient 07/03/2023   Application Items Received From Patient Application   Date Application Received From Provider 07/03/2023 07/03/2023  Date Application Submitted to Manufacturer 07/03/2023 07/03/2023  Patient Assistance Determination  Approved  Approval  Start Date  08/21/2023  Patient Notification Method  MyChart         07/03/2023 07/26/2023  Outreach Medication Three  Initial Outreach Date (Medication Three) 07/01/2023   Manufacturer Medication Three Novo Nordisk   Nordisk Drugs Pen Needles   Type of Forensic scientist Assistance  Date Application Sent to Patient 07/01/2023   Application Items Requested Application   Date Application Sent to Prescriber 07/01/2023   Name of Prescriber Dorothyann Peng   Anticpated Follow Up Date with Patient/Provider 07/03/2023   Date Application Received From Patient 07/03/2023   Application Items Received From Patient Application   Date Application Received From Provider 07/03/2023 07/03/2023  Date Application Submitted to Manufacturer 07/03/2023 07/03/2023  Method Application Sent to Manufacturer Fax Fax  Anticipated Follow Up Date With Manufacturer 08/07/2023 08/07/2023  Patient Assistance Determination  Approved  Approval Start Date  08/21/2023  Approval End Date  08/19/2024  Patient Notification Method  MyChart     Catie Eppie Gibson, PharmD, BCACP, CPP Clinical Pharmacist [facility] [phone]

## 2023-08-05 NOTE — Telephone Encounter (Signed)
Care team updated. Records already in [facility]

## 2023-08-07 ENCOUNTER — Other Ambulatory Visit: Payer: Self-pay | Admitting: *Deleted

## 2023-08-07 DIAGNOSIS — R972 Elevated prostate specific antigen [PSA]: Secondary | ICD-10-CM

## 2023-08-09 ENCOUNTER — Other Ambulatory Visit: Payer: Medicare HMO

## 2023-08-09 DIAGNOSIS — R972 Elevated prostate specific antigen [PSA]: Secondary | ICD-10-CM | POA: Diagnosis not present

## 2023-08-10 LAB — PSA: Prostate Specific Ag, Serum: 4.9 ng/mL — ABNORMAL HIGH (ref 0.0–4.0)

## 2023-08-12 ENCOUNTER — Other Ambulatory Visit: Payer: Medicare HMO

## 2023-08-12 DIAGNOSIS — E1169 Type 2 diabetes mellitus with other specified complication: Secondary | ICD-10-CM

## 2023-08-12 DIAGNOSIS — E785 Hyperlipidemia, unspecified: Secondary | ICD-10-CM | POA: Diagnosis not present

## 2023-08-12 LAB — BASIC METABOLIC PANEL
BUN/Creatinine Ratio: 15 (ref 10–24)
BUN: 17 mg/dL (ref 8–27)
CO2: 24 mmol/L (ref 20–29)
Calcium: 9.3 mg/dL (ref 8.6–10.2)
Chloride: 101 mmol/L (ref 96–106)
Creatinine, Ser: 1.13 mg/dL (ref 0.76–1.27)
Glucose: 150 mg/dL — ABNORMAL HIGH (ref 70–99)
Potassium: 5.3 mmol/L — ABNORMAL HIGH (ref 3.5–5.2)
Sodium: 137 mmol/L (ref 134–144)
eGFR: 72 mL/min/{1.73_m2} (ref >59)

## 2023-08-12 LAB — HEMOGLOBIN A1C
Est. average glucose Bld gHb Est-mCnc: 197 mg/dL
Hgb A1c MFr Bld: 8.5 % — ABNORMAL HIGH (ref 4.8–5.6)

## 2023-08-30 ENCOUNTER — Encounter: Payer: Self-pay | Admitting: Pharmacist

## 2023-09-02 ENCOUNTER — Other Ambulatory Visit: Payer: Self-pay | Admitting: Pharmacist

## 2023-09-02 MED ORDER — METFORMIN HCL 500 MG PO TABS: 1000.0000 mg | ORAL_TABLET | Freq: Two times a day (BID) | ORAL | 3 refills | Status: DC

## 2023-09-02 NOTE — Progress Notes (Signed)
 09/02/2023 Name: Levi Roberts MRN: [id number] DOB: May 06, 1956  Chief Complaint  Patient presents with   Medication Management   Diabetes    Levi Roberts is a 68 y.o. year old male who presented for a telephone visit.   They were referred to the pharmacist by their PCP for assistance in managing diabetes, hypertension, and hyperlipidemia.    Subjective:  Care Team: Primary Care Provider: Jarold Medici, MD ; Next Scheduled Visit: 11/05/23  Medication Access/Adherence  Current Pharmacy:  [facility] - [place], [place] - [address] [place] [address] Phone: [phone] Fax: [phone]  [facility] [address] [place]), [place] - [address] [place]) [place] [address] Phone: [phone] Fax: [phone]   Patient reports affordability concerns with their medications: No  Patient reports access/transportation concerns to their pharmacy: No  Patient reports adherence concerns with their medications:  No     Diabetes:  Current medications: metformin  500 mg twice daily, Jardiance  25 mg daily, Ozempic  2 mg weekly, Tresiba  U200 60 units daily  Current glucose readings: fastings: 90-130s; post-lunch: 140s-160s;   Patient denies hypoglycemic s/sx including dizziness, shakiness, sweating. Patient denies hyperglycemic symptoms including polyuria, polydipsia, polyphagia, nocturia, neuropathy, blurred vision.  Current meal patterns: works 3 days weekly;  - Breakfast: just fruit on work days; on non-work days sometimes bacon, eggs; water  - Lunch: generally skips on work days; on non-work days will have a turkey or ham sandwich ([employer]);  - Supper: spaghetti, steak; pork chops; baked potato; spinach and broccoli, lima beans; Tuesday - tacos/burritos w/ chips and salsa;  - Drinks: water;   Current medication access support: approved for Novo Nordisk and [employer] Cares assistance for  2025  Hypertension:  Current medications: telmisartan  20 mg daily   Patient has a validated, automated, upper arm home BP cuff Current blood pressure readings readings: 128/76   Hyperlipidemia/ASCVD Risk Reduction  Current lipid lowering medications: atorvastatin  20 mg daily   Objective:  Lab Results  Component Value Date   HGBA1C 8.5 (H) 08/12/2023    Lab Results  Component Value Date   CREATININE 1.13 08/12/2023   BUN 17 08/12/2023   NA 137 08/12/2023   K 5.3 (H) 08/12/2023   CL 101 08/12/2023   CO2 24 08/12/2023    Lab Results  Component Value Date   CHOL 110 10/29/2022   HDL 33 (L) 10/29/2022   LDLCALC 61 10/29/2022   TRIG 77 10/29/2022   CHOLHDL 3.3 10/29/2022    Medications Reviewed Today     Reviewed by Rudy Dorothyann DASEN, RPH-CPP (Pharmacist) on 09/02/23 at 0905  Med List Status: <None>   Medication Order Taking? Sig Documenting Provider Last Dose Status Informant  atorvastatin  (LIPITOR) 20 MG tablet [id number] Yes Take 1 tablet (20 mg total) by mouth daily. Jarold Medici, MD Taking Active   empagliflozin  (JARDIANCE ) 25 MG TABS tablet [id number] Yes Take 1 tablet (25 mg total) by mouth daily. Jarold Medici, MD Taking Active   finasteride  (PROSCAR ) 5 MG tablet [id number] Yes Take 1 tablet (5 mg total) by mouth daily. Twylla Glendia BROCKS, MD Taking Active   insulin  degludec (TRESIBA  FLEXTOUCH) 200 UNIT/ML FlexTouch Pen [id number] Yes Inject 60 Units into the skin daily. [provider] Taking Active   metFORMIN  (GLUCOPHAGE ) 500 MG tablet [id number] Yes Take 1 tablet (500 mg total) by mouth 2 (two) times daily with a meal. Jarold Medici, MD Taking Active   Semaglutide ,  2 MG/DOSE, (OZEMPIC , 2 MG/DOSE,) 8 MG/3ML SOPN [id number] Yes Inject 2 mg into the skin once a week. Jarold Medici, MD Taking Active   tamsulosin  (FLOMAX ) 0.4 MG CAPS capsule [id number] Yes Take 2 capsules (0.8 mg total) by mouth daily. Twylla Glendia BROCKS, MD Taking Active   telmisartan   (MICARDIS ) 20 MG tablet [id number] Yes TAKE 1 TABLET BY MOUTH DAILY. Jarold Medici, MD Taking Active               Assessment/Plan:   Diabetes: - Currently uncontrolled. Likely post-supper elevations that we are not currently seeing.  - Reviewed goal A1c, goal fasting, and goal 2 hour post prandial glucose - Reviewed dietary modifications including: focusing on Plate method, moderating portion size  - Recommend to increase metformin  to 1000 mg twice daily, reduce Tresiba  to 54 units daily. Continue Ozempic  2 mg weekly and Jardiance  25 mg daily.  - Recommend to check glucose, adding checks about 2 hours after supper.   Hypertension: - Currently controlled - Reviewed long term cardiovascular and renal outcomes of uncontrolled blood pressure - Reviewed appropriate blood pressure monitoring technique and reviewed goal blood pressure. Recommended to check home blood pressure and heart rate periodically, document, and provide at future visit.  - Recommend to continue current regimen  Hyperlipidemia/ASCVD Risk Reduction: - Currently controlled.  - Recommend to continue current regimen    Follow Up Plan: phone call in 4 weeks  Catie TSABRA Centers, PharmD, BCACP, CPP Clinical Pharmacist [facility] [phone]

## 2023-09-02 NOTE — Patient Instructions (Signed)
 Mr. Eberwein,   It was great talking to you today!  Increase metformin  to 2 tablets twice daily. Decrease Tresiba  to 56 units daily. Continue Ozempic  2 mg weekly and Jardiance  25 mg daily.   Check your blood sugars twice daily:  1) Fasting, first thing in the morning before breakfast and  2) 2 hours after your largest meal.   For a goal A1c of less than 7%, goal fasting readings are less than 130 and goal 2 hour after meal readings are less than 180.    If you have after supper readings greater than 180, think about how you could reduce the carbohydrate portion size in that meal.   Thanks!  Catie IVAR Centers, PharmD, BCACP, CPP Clinical Pharmacist [facility] [phone]

## 2023-09-30 ENCOUNTER — Other Ambulatory Visit: Payer: Self-pay

## 2023-09-30 ENCOUNTER — Other Ambulatory Visit (HOSPITAL_COMMUNITY): Payer: Self-pay

## 2023-09-30 NOTE — Progress Notes (Signed)
 09/30/2023 Name: Levi Roberts MRN: [id number] DOB: 1956/05/01  Chief Complaint  Patient presents with   Diabetes   Hypertension   Hyperlipidemia    HALDON BARLET is a 68 y.o. year old male who presented for a telephone visit.   They were referred to the pharmacist by their PCP for assistance in managing diabetes, hypertension, and hyperlipidemia.    Subjective:  Care Team: Primary Care Provider: Cleave Curling, MD ; Next Scheduled Visit: 11/05/2023  Medication Access/Adherence  Current Pharmacy:  [facility] - [place], [place] - [address] [place] [address] Phone: [phone] Fax: [phone]  [facility] [address] [place]), [place] - [address] [place] [address] Phone: [phone] Fax: [phone]   Patient reports affordability concerns with their medications: No  Patient reports access/transportation concerns to their pharmacy: No  Patient reports adherence concerns with their medications:  No     Diabetes:  Current medications: metformin  1000 mg BID, Jardiance  25 mg daily, Ozempic  2 mg weekly, Tresiba  54 units daily  Patient reports he is tolerating increased metformin  dose well. We discussed utilization of CGM today. Patient reported he has used Dexcom in the past, but the sensors would often get in the way and it was expensive with his insurance. Prefers to stay with finger stick BG checks for now.   Using glucometer; testing 3 times daily FBG: 94-110, no higher than 120 2 hr after lunch: 160-170 2 hr after dinner (before bed): 140-150  Patient denies hypoglycemic s/sx including dizziness, shakiness, sweating. Patient denies hyperglycemic symptoms including polyuria, polydipsia, polyphagia, nocturia, neuropathy, blurred vision.  Current medication access support: approved for Novo Nordisk (Ozempic ) and [employer] Cares (Jardiance ) assistance for 2025    Hypertension:  Current medications: telmisartan  20 mg daily  Patient has a validated, automated, upper arm home BP cuff Current blood pressure readings readings: 129/70, 131/82   Hyperlipidemia/ASCVD Risk Reduction  Current medications: atorvastatin  20 mg daily  ASCVD History: none Family History: mother - diabetes, heart attack Risk Factors: diabetes, hypertension, obesity   Objective:  Lab Results  Component Value Date   HGBA1C 8.5 (H) 08/12/2023    Lab Results  Component Value Date   CREATININE 1.13 08/12/2023   BUN 17 08/12/2023   NA 137 08/12/2023   K 5.3 (H) 08/12/2023   CL 101 08/12/2023   CO2 24 08/12/2023    Lab Results  Component Value Date   CHOL 110 10/29/2022   HDL 33 (L) 10/29/2022   LDLCALC 61 10/29/2022   TRIG 77 10/29/2022   CHOLHDL 3.3 10/29/2022    Medications Reviewed Today     Reviewed by Philmore Bream, RPH (Pharmacist) on 09/30/23 at 0906  Med List Status: <None>   Medication Order Taking? Sig Documenting Provider Last Dose Status Informant  atorvastatin  (LIPITOR) 20 MG tablet [id number] Yes Take 1 tablet (20 mg total) by mouth daily. Cleave Curling, MD Taking Active   empagliflozin  (JARDIANCE ) 25 MG TABS tablet [id number] Yes Take 1 tablet (25 mg total) by mouth daily. Cleave Curling, MD Taking Active   finasteride  (PROSCAR ) 5 MG tablet [id number] Yes Take 1 tablet (5 mg total) by mouth daily. Geraline Knapp, MD Taking Active   insulin  degludec (TRESIBA  FLEXTOUCH) 200 UNIT/ML FlexTouch Pen [id number] Yes Inject 60 Units into the skin daily. [provider] Taking Active            Med Note Holly Lush, Alrick Cubbage  R   Mon Sep 30, 2023  9:05 AM) Taking 54 units daily  metFORMIN  (GLUCOPHAGE ) 500 MG tablet [id number] Yes Take 2 tablets (1,000 mg total) by mouth 2 (two) times daily with a meal. Cleave Curling, MD Taking Active   Semaglutide , 2 MG/DOSE, (OZEMPIC , 2 MG/DOSE,) 8 MG/3ML SOPN [id number] Yes Inject 2 mg into the skin once a week.  Cleave Curling, MD Taking Active   tamsulosin  (FLOMAX ) 0.4 MG CAPS capsule [id number] Yes Take 2 capsules (0.8 mg total) by mouth daily. Geraline Knapp, MD Taking Active   telmisartan  (MICARDIS ) 20 MG tablet [id number] Yes TAKE 1 TABLET BY MOUTH DAILY. Cleave Curling, MD Taking Active               Assessment/Plan:   Diabetes: - Currently uncontrolled based on last A1c 8.5% on 08/12/23, but patient reported FBG and 2 hr PP BG readings are at goal. Suspect improvement in A1c at PCP visit next month. We discussed that if A1c remains above goal at next PCP visit, Libre CGM would be beneficial to better analyze BG trends. - Reviewed long term cardiovascular and renal outcomes of uncontrolled blood sugar - Reviewed goal A1c, goal fasting, and goal 2 hour post prandial glucose - Recommend to continue Tresiba  54 units daily, metformin  1000 mg BID, Ozempic  2 mg weekly, Jardiance  25 mg daily - Patient denies personal or family history of multiple endocrine neoplasia type 2, medullary thyroid  cancer; personal history of pancreatitis or gallbladder disease. - Recommend to check FBG and 2 hr PP BG daily (lunch and dinner) - Approved for Ozempic  PAP through Novo Nordisk and Jardiance  PAP through [facility] - F/u A1c due at next PCP visit in March     Hypertension: - Currently controlled based on patient reported home BP readings - Reviewed long term cardiovascular and renal outcomes of uncontrolled blood pressure - Reviewed appropriate blood pressure monitoring technique and reviewed goal blood pressure. Recommended to check home blood pressure and heart rate periodically - Recommend to continue telmisartan  20 mg daily      Hyperlipidemia/ASCVD Risk Reduction: - Currently controlled based on last LDL 61 mg/dL, at goal <62 mg/dL. - Recommend to continue atorvastatin  20 mg daily  - F/u lipid panel due at next PCP visit in March    Follow Up Plan: PCP on 11/05/23 and PharmD on  11/18/23  Georga Killings, PharmD PGY-1 Pharmacy Resident

## 2023-10-03 ENCOUNTER — Telehealth: Payer: Self-pay

## 2023-10-03 ENCOUNTER — Telehealth: Payer: Self-pay | Admitting: Pharmacist

## 2023-10-03 DIAGNOSIS — E1169 Type 2 diabetes mellitus with other specified complication: Secondary | ICD-10-CM

## 2023-10-03 NOTE — Progress Notes (Signed)
   10/03/2023  Patient ID: Levi Roberts, male   DOB: [birth year], 68 y.o.   MRN: [id number]    10/03/2023  Levi Roberts Oct 02, 1955 [id number]   2025 Medication Assistance Renewal Application Summary:  Patient called regarding medication assistance renewal for 2025. Verified address, anticipated insurance for 2025, and income has not changed. He said his applications had been completed in November of 2024.  [employer]ovo [employer] was called and confirmation was obtained that the patient was approved for [place], Ozempic and Novofine needles until 08/19/2024.   Unfortunately, [employer] is very behind on shipping.   Medication Review Findings:  Medications Reviewed Today     Reviewed by Beecher Mcardle, [facility] (Pharmacist) on 10/03/23 at 1534  Med List Status: <None>   Medication Order Taking? Sig Documenting Provider Last Dose Status Informant  atorvastatin (LIPITOR) 20 MG tablet [id number] Yes Take 1 tablet (20 mg total) by mouth daily. Dorothyann Peng, MD Taking Active   empagliflozin (JARDIANCE) 25 MG TABS tablet [id number] Yes Take 1 tablet (25 mg total) by mouth daily. Dorothyann Peng, MD Taking Active   finasteride (PROSCAR) 5 MG tablet [id number] Yes Take 1 tablet (5 mg total) by mouth daily. Stoioff, Verna Czech, MD Taking Active   insulin degludec (TRESIBA FLEXTOUCH) 200 UNIT/ML FlexTouch Pen [id number] Yes Inject 60 Units into the skin daily. [provider] Taking Active            Med Note Noe Gens, KATIE R   Mon Sep 30, 2023  9:05 AM) Taking 54 units daily  metFORMIN (GLUCOPHAGE) 500 MG tablet [id number] Yes Take 2 tablets (1,000 mg total) by mouth 2 (two) times daily with a meal. Dorothyann Peng, MD Taking Active   Semaglutide, 2 MG/DOSE, (OZEMPIC, 2 MG/DOSE,) 8 MG/3ML SOPN [id number] Yes Inject 2 mg into the skin once a week. Dorothyann Peng, MD Taking Active   tamsulosin [facility]) 0.4 MG CAPS capsule [id number] Yes Take 2 capsules (0.8 mg total) by mouth daily. Riki Altes, MD Taking Active   telmisartan (MICARDIS) 20 MG tablet [id number] Yes TAKE 1 TABLET BY MOUTH DAILY. Dorothyann Peng, MD Taking Active            Medication Assistance Findings:  Medication assistance needs identified: London Pepper is available through [employer]'s Program.  Will coordinate with Medication Assistance Team.     Additional medication assistance options reviewed with patient as warranted:  Coupon and Discount pharmacy lists--Novo Nordisk will do one-time coupons for their products when patients are out of medication.  I will send Patient the number via MyChart.  He said that He already spoke with Dr. Allyne Gee and will get samples in the interim.  Plan: Pharmacy technician will assist with obtaining all required documents from both patient and provider(s) and submit application(s) once completed.   Follow up in 1 month Route note to Provider to request samples.   Beecher Mcardle, PharmD, BCACP Clinical Pharmacist [phone]

## 2023-10-03 NOTE — Telephone Encounter (Signed)
PAP: Patient assistance application for Jardiance through [employer]) has been mailed to pt's home address on file. Provider portion of application will be faxed to provider's office.  Please note I have faxed the provider forms to Dr. Dorothyann Peng

## 2023-10-07 NOTE — Telephone Encounter (Deleted)
 Received provider portion of PAP Application for [facility]

## 2023-10-11 ENCOUNTER — Telehealth: Payer: Self-pay | Admitting: Pharmacist

## 2023-10-11 DIAGNOSIS — E669 Obesity, unspecified: Secondary | ICD-10-CM

## 2023-10-11 MED ORDER — OZEMPIC (2 MG/DOSE) 8 MG/3ML ~~LOC~~ SOPN: 2.0000 mg | PEN_INJECTOR | SUBCUTANEOUS | 1 refills | Status: AC

## 2023-10-11 MED ORDER — TRESIBA FLEXTOUCH 200 UNIT/ML ~~LOC~~ SOPN
54.0000 [IU] | PEN_INJECTOR | Freq: Every day | SUBCUTANEOUS | 1 refills | Status: AC
Start: 2023-10-11 — End: ?

## 2023-10-11 MED ORDER — NOVOFINE PEN NEEDLE 32G X 6 MM MISC: 1 refills | Status: AC

## 2023-10-11 NOTE — Progress Notes (Signed)
   10/11/2023  Patient ID: Levi Roberts, male   DOB: 1955-08-22, 68 y.o.   MRN: [id number]  Patient was called regarding medication assistance with Guinea-B[place]au and Ozempic.  HIPAA identifiers were obtained. Unfortunately, [employer] is behind on shipping.  He has been approved to receive both Ozempic and Guinea-B[place]au through [employer] through the end of the year.  A conference call was made with the Patient and the [employer] Program.  Free one time fill vouchers were obtained for Ozempic, [place] and [employer] needles.  The following information should be used to bill each of the products:   Ozempic BIN [id number] [id number] ID [id number] Illene Regulus [id number] [id number] ID [id number] NovoFine Needles-BIN [id number] [id number] ID [id number]   The company emailed this information to the Patient.    Plan: Refills and a new prescription for Novo Fine was "pended" for signature for Dr. Allyne Gee. I will follow up with the Pharmacy and Patient to be sure billing was handled correctly.  Beecher Mcardle, PharmD, BCACP Clinical Pharmacist [phone]

## 2023-10-11 NOTE — Progress Notes (Deleted)
   10/11/2023  Patient ID: Levi Roberts, male   DOB: [birth year], 68 y.o.   MRN: [id number]

## 2023-10-23 NOTE — Telephone Encounter (Signed)
 Reached out to Patient in regards to application for Jardiance (BI) left HIPAA compliant V/M to return call.

## 2023-10-24 ENCOUNTER — Telehealth: Payer: Self-pay | Admitting: Pharmacist

## 2023-10-24 DIAGNOSIS — E785 Hyperlipidemia, unspecified: Secondary | ICD-10-CM

## 2023-10-24 NOTE — Telephone Encounter (Signed)
 Pt states he is out of the Semaglutide, 2 MG/DOSE, (OZEMPIC, 2 MG/DOSE,) 8 MG/3ML SOPN and insulin degludec (TRESIBA FLEXTOUCH) 200 UNIT/ML FlexTouch .Pen    Pt went to pharmacy and the only thing they had for him was the needles.  Pt would like to know if you can follow up w/ pharmacy and provide the information he needs to get the free month of each? Pt states he has been having trouble w/ his internet/email and did not receive anyting like he was supposed to.

## 2023-10-24 NOTE — Progress Notes (Signed)
   10/24/2023  Patient ID: Levi Roberts, male   DOB: 1956-07-19, 68 y.o.   MRN: [id number]  Received message that Patient called the office wondering if I could follow up on his Tresiba and Ozempic prescriptions.  The Patient is already approved to receive these medications through [employer]'s Patient Assistance Program but they have been behind on their shipping.  [employer]'s Patient Assistance Program was called with the Patient on conference.  The following information was obtained for billing:  Ozempic BIN [id number] [id number] ID [id number] Illene Regulus [id number] PCN[id number] ID [id number] NovoFine Needles-BIN [id number] [id number] ID [id number]  The patient was able to pick up the needles but was not able to get the [place] or Ozempic.  The local pharmacy was called.  I provided the billing information to the Pharmacy personnel and they were able to get Tresiba billed correctly but said the ID for the Ozempic did not work.  The [employer] was and a conference call was had with the [employer].    Apparently a Patient can only get 120 days of therapy at one time. The Novofine needles were run through at a 90 day supply.  The Pharmacist was asked to rebill the Novofine needles for a 30 day supply so that the other prescriptions will go through.    We were finally able to get them all to go through.  Patient was called.  He will go pick them up today.  Beecher Mcardle, PharmD, BCACP Clinical Pharmacist [phone]

## 2023-10-28 ENCOUNTER — Telehealth: Payer: Self-pay | Admitting: Pharmacist

## 2023-10-28 DIAGNOSIS — E66812 Obesity, class 2: Secondary | ICD-10-CM

## 2023-10-28 NOTE — Progress Notes (Signed)
   10/28/2023  Patient ID: Levi Roberts, male   DOB: 1956-05-07, 68 y.o.   MRN: [id number]  Called patient to follow up with him to be sure he was able to pick up his medications from the local pharmacy.  There were some issues with the coupon last week.  Patient said he was able to get his medications at no cost.  His next follow up appointment with Dr. Allyne Gee is:  11/05/23.   HgA1c 8.5%.    Plan: Follow up with Patient after his upcoming appointment.  Beecher Mcardle, PharmD, BCACP Clinical Pharmacist [phone]

## 2023-10-29 NOTE — Telephone Encounter (Signed)
 Received patient portion of application in the mail.  Refaxed provider portion to Dr. Allyne Gee to review

## 2023-10-30 NOTE — Telephone Encounter (Signed)
 PAP: Application for London Pepper has been submitted to [employer]), via fax

## 2023-10-31 ENCOUNTER — Telehealth: Payer: Self-pay

## 2023-10-31 NOTE — Telephone Encounter (Signed)
 Patient's meds have arrived here at the office. Patient's wife is coming to pick up his meds along with hers. YL,RMA

## 2023-11-05 ENCOUNTER — Ambulatory Visit (INDEPENDENT_AMBULATORY_CARE_PROVIDER_SITE_OTHER): Payer: 59 | Admitting: Internal Medicine

## 2023-11-05 ENCOUNTER — Encounter: Payer: Self-pay | Admitting: Internal Medicine

## 2023-11-05 VITALS — BP 124/78 | HR 89 | Temp 98.3°F | Ht 75.0 in | Wt 319.4 lb

## 2023-11-05 DIAGNOSIS — Z23 Encounter for immunization: Secondary | ICD-10-CM | POA: Diagnosis not present

## 2023-11-05 DIAGNOSIS — E785 Hyperlipidemia, unspecified: Secondary | ICD-10-CM

## 2023-11-05 DIAGNOSIS — R8290 Unspecified abnormal findings in urine: Secondary | ICD-10-CM | POA: Diagnosis not present

## 2023-11-05 DIAGNOSIS — I10 Essential (primary) hypertension: Secondary | ICD-10-CM | POA: Diagnosis not present

## 2023-11-05 DIAGNOSIS — Z0000 Encounter for general adult medical examination without abnormal findings: Secondary | ICD-10-CM

## 2023-11-05 DIAGNOSIS — Z6839 Body mass index (BMI) 39.0-39.9, adult: Secondary | ICD-10-CM

## 2023-11-05 DIAGNOSIS — E66812 Obesity, class 2: Secondary | ICD-10-CM

## 2023-11-05 DIAGNOSIS — E1169 Type 2 diabetes mellitus with other specified complication: Secondary | ICD-10-CM

## 2023-11-05 DIAGNOSIS — R9431 Abnormal electrocardiogram [ECG] [EKG]: Secondary | ICD-10-CM | POA: Diagnosis not present

## 2023-11-05 LAB — POCT URINALYSIS DIPSTICK
Bilirubin, UA: NEGATIVE
Glucose, UA: NEGATIVE
Ketones, UA: NEGATIVE
Nitrite, UA: NEGATIVE
Protein, UA: POSITIVE — AB
Spec Grav, UA: 1.02 (ref 1.010–1.025)
Urobilinogen, UA: 2 U/dL — AB
pH, UA: 7 (ref 5.0–8.0)

## 2023-11-05 MED ORDER — ATORVASTATIN CALCIUM 20 MG PO TABS: 20.0000 mg | ORAL_TABLET | Freq: Every day | ORAL | 2 refills | Status: DC

## 2023-11-05 NOTE — Progress Notes (Signed)
 I,Levi Roberts, CMA,acting as a Neurosurgeon for Levi Aliment, MD.,have documented all relevant documentation on the behalf of Levi Aliment, MD,as directed by  Levi Aliment, MD while in the presence of Levi Aliment, MD.  Subjective:   Patient ID: Levi Roberts , male    DOB: [birth year] , 68 y.o.   MRN: [id number]  Chief Complaint  Patient presents with   Annual Exam   Diabetes   Hypertension    HPI  He presents today for physical exam. He completes prostate exams with Urologist. He reports compliance with medications. Denies headache, chest pain & sob. He has no specific questions or concerns.   He reports compliance with meds; however, states he was out of insulin for 3 days and Ozempic for one week while waiting for shipment for [employer].   Diabetes He presents for his follow-up diabetic visit. He has type 2 diabetes mellitus. His disease course has been stable. There are no hypoglycemic associated symptoms. Pertinent negatives for hypoglycemia include no dizziness or headaches. Pertinent negatives for diabetes include no blurred vision, no fatigue, no polydipsia, no polyphagia and no polyuria. There are no hypoglycemic complications. Risk factors for coronary artery disease include diabetes mellitus, dyslipidemia, hypertension, male sex, obesity and sedentary lifestyle. He is compliant with treatment some of the time. He is following a diabetic diet. He participates in exercise intermittently. His breakfast blood glucose is taken between 8-9 am. His breakfast blood glucose range is generally 130-140 mg/dl. (Blood sugar has been as high as 204 since cutting back on his insulin. ) An ACE inhibitor/angiotensin II receptor blocker is being taken.  Hypertension This is a chronic problem. The current episode started more than 1 year ago. The problem has been gradually improving since onset. The problem is controlled. Pertinent negatives include no blurred vision or headaches.  Risk factors for coronary artery disease include diabetes mellitus, dyslipidemia, obesity and sedentary lifestyle. Past treatments include angiotensin blockers. The current treatment provides moderate improvement. Compliance problems include exercise.   Hyperlipidemia This is a chronic problem. The current episode started more than 1 year ago. Exacerbating diseases include diabetes and obesity. Current antihyperlipidemic treatment includes statins. Risk factors for coronary artery disease include diabetes mellitus, dyslipidemia, hypertension and male sex.     Past Medical History:  Diagnosis Date   Diabetes mellitus without complication (HCC)    High cholesterol    Hypertension      Family History  Problem Relation Age of Onset   Diabetes Mother    Heart attack Mother    Kidney cancer Father    Diabetes Sister    Healthy Sister      Current Outpatient Medications:    empagliflozin (JARDIANCE) 25 MG TABS tablet, Take 1 tablet (25 mg total) by mouth daily., Disp: 90 tablet, Rfl: 3   finasteride (PROSCAR) 5 MG tablet, Take 1 tablet (5 mg total) by mouth daily., Disp: 90 tablet, Rfl: 3   insulin degludec (TRESIBA FLEXTOUCH) 200 UNIT/ML FlexTouch Pen, Inject 54 Units into the skin daily., Disp: 9 mL, Rfl: 1   Insulin Pen Needle (NOVOFINE PEN NEEDLE) 32G X 6 MM MISC, Use for daily insulin injections, Disp: 100 each, Rfl: 1   metFORMIN (GLUCOPHAGE) 500 MG tablet, Take 2 tablets (1,000 mg total) by mouth 2 (two) times daily with a meal., Disp: 360 tablet, Rfl: 3   Semaglutide, 2 MG/DOSE, (OZEMPIC, 2 MG/DOSE,) 8 MG/3ML SOPN, Inject 2 mg into the skin once a week., Disp:  3 mL, Rfl: 1   tamsulosin (FLOMAX) 0.4 MG CAPS capsule, Take 2 capsules (0.8 mg total) by mouth daily., Disp: 60 capsule, Rfl: 11   telmisartan (MICARDIS) 20 MG tablet, TAKE 1 TABLET BY MOUTH DAILY., Disp: 90 tablet, Rfl: 3   atorvastatin (LIPITOR) 20 MG tablet, Take 1 tablet (20 mg total) by mouth daily., Disp: 90 tablet, Rfl:  2   No Known Allergies   Men's preventive visit. Patient Health Questionnaire (PHQ-2) is  Flowsheet Row Office Visit from 11/05/2023 in [facility]  PHQ-2 Total Score 0     . Patient is on a diabetic diet. Marital status: Married. Relevant history for alcohol use is:  Social History   Substance and Sexual Activity  Alcohol Use Never  . Relevant history for tobacco use is:  Social History   Tobacco Use  Smoking Status Never  Smokeless Tobacco Never  .   Review of Systems  Constitutional: Negative.  Negative for fatigue.  HENT: Negative.    Eyes: Negative.  Negative for blurred vision.  Respiratory: Negative.    Cardiovascular: Negative.   Gastrointestinal: Negative.   Endocrine: Negative.  Negative for polydipsia, polyphagia and polyuria.  Genitourinary: Negative.   Musculoskeletal: Negative.   Skin: Negative.   Allergic/Immunologic: Negative.   Neurological: Negative.  Negative for dizziness and headaches.  Hematological: Negative.      Today's Vitals   11/05/23 1028  BP: 124/78  Pulse: 89  Temp: 98.3 F (36.8 C)  SpO2: 98%  Weight: (!) 319 lb 6.4 oz (144.9 kg)  Height: 6\' 3"  (1.905 m)   Body mass index is 39.92 kg/m.  Wt Readings from Last 3 Encounters:  11/05/23 (!) 319 lb 6.4 oz (144.9 kg)  06/10/23 (!) 316 lb 6.4 oz (143.5 kg)  05/08/23 (!) 308 lb (139.7 kg)    Objective:  Physical Exam Vitals and nursing note reviewed.  Constitutional:      Appearance: Normal appearance. He is obese.  HENT:     Head: Normocephalic and atraumatic.     Right Ear: Tympanic membrane, ear canal and external ear normal. There is no impacted cerumen.     Left Ear: Tympanic membrane, ear canal and external ear normal. There is no impacted cerumen.     Mouth/Throat:     Pharynx: No oropharyngeal exudate or posterior oropharyngeal erythema.  Eyes:     Extraocular Movements: Extraocular movements intact.     Conjunctiva/sclera:  Conjunctivae normal.     Pupils: Pupils are equal, round, and reactive to light.  Cardiovascular:     Rate and Rhythm: Normal rate and regular rhythm.     Heart sounds: Normal heart sounds.  Pulmonary:     Effort: Pulmonary effort is normal.     Breath sounds: Normal breath sounds.  Abdominal:     General: Bowel sounds are normal.     Palpations: Abdomen is soft.     Comments: Obese, soft  Genitourinary:    Comments: Deferred  Musculoskeletal:        General: Normal range of motion.     Cervical back: Normal range of motion.  Skin:    General: Skin is warm.  Neurological:     General: No focal deficit present.     Mental Status: He is alert.  Psychiatric:        Mood and Affect: Mood normal.         Assessment And Plan:    Encounter for annual physical exam Assessment &  Plan: A full exam was performed.  DRE deferred.  He is advised to get 30-45 minutes of regular exercise, no less than four to five days per week. Both weight-bearing and aerobic exercises are recommended.  He is advised to follow a healthy diet with at least six fruits/veggies per day, decrease intake of red meat and other saturated fats and to increase fish intake to twice weekly.  Meats/fish should not be fried -- baked, boiled or broiled is preferable. It is also important to cut back on your sugar intake.  Be sure to read labels - try to avoid anything with added sugar, high fructose corn syrup or other sweeteners.  If you must use a sweetener, you can try stevia or monkfruit.  It is also important to avoid artificially sweetened foods/beverages and diet drinks. Lastly, wear SPF 50 sunscreen on exposed skin and when in direct sunlight for an extended period of time.  Be sure to avoid fast food restaurants and aim for at least 60 ounces of water daily.       Dyslipidemia associated with type 2 diabetes mellitus (HCC) Assessment & Plan: Chronic, LDL goal is less than 70. Diabetic foot exam was performed.  He  will continue with atorvastatin 20mg  daily. He will likely need metformin to help him reach optimal glycemic control.  He will continue with Tresiba 54 units nightly, Jardiance 25mg  daily, metformin 500mg  2 tabs po twice daily, and Ozempic 2mg  weekly. If A1c not less than 8, will add metformin, he is willing to take once daily.   Orders: -     CMP14+EGFR -     Lipid panel -     Hemoglobin A1c -     CBC -     POCT urinalysis dipstick -     Microalbumin / creatinine urine ratio -     EKG 12-Lead -     Atorvastatin Calcium; Take 1 tablet (20 mg total) by mouth daily.  Dispense: 90 tablet; Refill: 2  Essential hypertension Assessment & Plan: Chronic, fair control. Goal BP<120/80.  EKG performed, NSR w/ negative T-waves and lateral ischemia. He will continue with telmisartan 20mg  daily. Encouraged to follow low sodium diet. He will f/u in four months for re-evaluation.   Orders: -     CMP14+EGFR -     Lipid panel  Abnormal EKG Assessment & Plan: He has lateral ischemia noted on EKG. He denies having any cp, sob w/ exertion. Due to his cardiac risk factors, I will refer him to Cardiology for further evaluation. He is in agreement with treatment plan.   Orders: -     Ambulatory referral to Cardiology  Abnormal urine -     Urine Culture  Class 2 severe obesity due to excess calories with serious comorbidity and body mass index (BMI) of 39.0 to 39.9 in adult [facility]) Assessment & Plan: He is aware of 3lb weight gain since October 2024. He is encouraged to aim for at least 150 minutes of exercise per week.    Immunization due -     Pneumococcal conjugate vaccine 20-valent  He is encouraged to strive for BMI less than 30 to decrease cardiac risk. Advised to aim for at least 150 minutes of exercise per week.    Return in 3 months (on 02/05/2024), or dm check, for 1 year physical. Patient was given opportunity to ask questions. Patient verbalized understanding of the plan and was able to  repeat key elements of the plan. All questions were  answered to their satisfaction.    I, Levi Aliment, MD, have reviewed all documentation for this visit. The documentation on 11/09/23 for the exam, diagnosis, procedures, and orders are all accurate and complete.

## 2023-11-05 NOTE — Patient Instructions (Signed)
 Health Maintenance, Male  Adopting a healthy lifestyle and getting preventive care are important in promoting health and wellness. Ask your health care provider about:  The right schedule for you to have regular tests and exams.  Things you can do on your own to prevent diseases and keep yourself healthy.  What should I know about diet, weight, and exercise?  Eat a healthy diet    Eat a diet that includes plenty of vegetables, fruits, low-fat dairy products, and lean protein.  Do not eat a lot of foods that are high in solid fats, added sugars, or sodium.  Maintain a healthy weight  Body mass index (BMI) is a measurement that can be used to identify possible weight problems. It estimates body fat based on height and weight. Your health care provider can help determine your BMI and help you achieve or maintain a healthy weight.  Get regular exercise  Get regular exercise. This is one of the most important things you can do for your health. Most adults should:  Exercise for at least 150 minutes each week. The exercise should increase your heart rate and make you sweat (moderate-intensity exercise).  Do strengthening exercises at least twice a week. This is in addition to the moderate-intensity exercise.  Spend less time sitting. Even light physical activity can be beneficial.  Watch cholesterol and blood lipids  Have your blood tested for lipids and cholesterol at 67 years of age, then have this test every 5 years.  You may need to have your cholesterol levels checked more often if:  Your lipid or cholesterol levels are high.  You are older than 67 years of age.  You are at high risk for heart disease.  What should I know about cancer screening?  Many types of cancers can be detected early and may often be prevented. Depending on your health history and family history, you may need to have cancer screening at various ages. This may include screening for:  Colorectal cancer.  Prostate cancer.  Skin cancer.  Lung  cancer.  What should I know about heart disease, diabetes, and high blood pressure?  Blood pressure and heart disease  High blood pressure causes heart disease and increases the risk of stroke. This is more likely to develop in people who have high blood pressure readings or are overweight.  Talk with your health care provider about your target blood pressure readings.  Have your blood pressure checked:  Every 3-5 years if you are 9-95 years of age.  Every year if you are 85 years old or older.  If you are between the ages of 29 and 29 and are a current or former smoker, ask your health care provider if you should have a one-time screening for abdominal aortic aneurysm (AAA).  Diabetes  Have regular diabetes screenings. This checks your fasting blood sugar level. Have the screening done:  Once every three years after age 23 if you are at a normal weight and have a low risk for diabetes.  More often and at a younger age if you are overweight or have a high risk for diabetes.  What should I know about preventing infection?  Hepatitis B  If you have a higher risk for hepatitis B, you should be screened for this virus. Talk with your health care provider to find out if you are at risk for hepatitis B infection.  Hepatitis C  Blood testing is recommended for:  Everyone born from [birth year] through 1964.  Anyone  with known risk factors for hepatitis C.  Sexually transmitted infections (STIs)  You should be screened each year for STIs, including gonorrhea and chlamydia, if:  You are sexually active and are younger than 67 years of age.  You are older than 67 years of age and your health care provider tells you that you are at risk for this type of infection.  Your sexual activity has changed since you were last screened, and you are at increased risk for chlamydia or gonorrhea. Ask your health care provider if you are at risk.  Ask your health care provider about whether you are at high risk for HIV. Your health care provider  may recommend a prescription medicine to help prevent HIV infection. If you choose to take medicine to prevent HIV, you should first get tested for HIV. You should then be tested every 3 months for as long as you are taking the medicine.  Follow these instructions at home:  Alcohol use  Do not drink alcohol if your health care provider tells you not to drink.  If you drink alcohol:  Limit how much you have to 0-2 drinks a day.  Know how much alcohol is in your drink. In the [place] one drink equals one 12 oz bottle of beer (355 mL), one 5 oz glass of wine (148 mL), or one 1 oz glass of hard liquor (44 mL).  Lifestyle  Do not use any products that contain nicotine or tobacco. These products include cigarettes, chewing tobacco, and vaping devices, such as e-cigarettes. If you need help quitting, ask your health care provider.  Do not use street drugs.  Do not share needles.  Ask your health care provider for help if you need support or information about quitting drugs.  General instructions  Schedule regular health, dental, and eye exams.  Stay current with your vaccines.  Tell your health care provider if:  You often feel depressed.  You have ever been abused or do not feel safe at home.  Summary  Adopting a healthy lifestyle and getting preventive care are important in promoting health and wellness.  Follow your health care provider's instructions about healthy diet, exercising, and getting tested or screened for diseases.  Follow your health care provider's instructions on monitoring your cholesterol and blood pressure.  This information is not intended to replace advice given to you by your health care provider. Make sure you discuss any questions you have with your health care provider.  Document Revised: 12/26/2020 Document Reviewed: 12/26/2020  Elsevier Patient Education  2024 [employer].

## 2023-11-06 ENCOUNTER — Encounter: Payer: Self-pay | Admitting: Internal Medicine

## 2023-11-06 LAB — CBC
Hematocrit: 40.5 % (ref 37.5–51.0)
Hemoglobin: 13 g/dL (ref 13.0–17.7)
MCH: 27 pg (ref 26.6–33.0)
MCHC: 32.1 g/dL (ref 31.5–35.7)
MCV: 84 fL (ref 79–97)
Platelets: 248 10*3/uL (ref 150–450)
RBC: 4.81 x10E6/uL (ref 4.14–5.80)
RDW: 13.8 % (ref 11.6–15.4)
WBC: 4.8 10*3/uL (ref 3.4–10.8)

## 2023-11-06 LAB — CMP14+EGFR
ALT: 11 IU/L (ref 0–44)
AST: 12 IU/L (ref 0–40)
Albumin: 4 g/dL (ref 3.9–4.9)
Alkaline Phosphatase: 78 IU/L (ref 44–121)
BUN/Creatinine Ratio: 14 (ref 10–24)
BUN: 15 mg/dL (ref 8–27)
Bilirubin Total: 0.5 mg/dL (ref 0.0–1.2)
CO2: 21 mmol/L (ref 20–29)
Calcium: 9.5 mg/dL (ref 8.6–10.2)
Chloride: 102 mmol/L (ref 96–106)
Creatinine, Ser: 1.08 mg/dL (ref 0.76–1.27)
Globulin, Total: 3.4 g/dL (ref 1.5–4.5)
Glucose: 148 mg/dL — ABNORMAL HIGH (ref 70–99)
Potassium: 5 mmol/L (ref 3.5–5.2)
Sodium: 137 mmol/L (ref 134–144)
Total Protein: 7.4 g/dL (ref 6.0–8.5)
eGFR: 76 mL/min/{1.73_m2} (ref >59)

## 2023-11-06 LAB — MICROALBUMIN / CREATININE URINE RATIO
Creatinine, Urine: 143.8 mg/dL
Microalb/Creat Ratio: 13 mg/g{creat} (ref 0–29)
Microalbumin, Urine: 18 ug/mL

## 2023-11-06 LAB — LIPID PANEL
Chol/HDL Ratio: 3.4 ratio (ref 0.0–5.0)
Cholesterol, Total: 114 mg/dL (ref 100–199)
HDL: 34 mg/dL — ABNORMAL LOW (ref >39)
LDL Chol Calc (NIH): 67 mg/dL (ref 0–99)
Triglycerides: 60 mg/dL (ref 0–149)
VLDL Cholesterol Cal: 13 mg/dL (ref 5–40)

## 2023-11-06 LAB — HEMOGLOBIN A1C
Est. average glucose Bld gHb Est-mCnc: 203 mg/dL
Hgb A1c MFr Bld: 8.7 % — ABNORMAL HIGH (ref 4.8–5.6)

## 2023-11-06 NOTE — Telephone Encounter (Signed)
 Reached out to patient regarding proof of income document needed per letter sent from [facility]. Will resend for review when document is received.

## 2023-11-08 ENCOUNTER — Telehealth: Payer: Self-pay

## 2023-11-08 NOTE — Telephone Encounter (Signed)
 Patient was identified as falling into the [place] Measure - Diabetes.   Patient was: Appointment scheduled for lab or office visit for A1c.

## 2023-11-09 DIAGNOSIS — R9431 Abnormal electrocardiogram [ECG] [EKG]: Secondary | ICD-10-CM | POA: Insufficient documentation

## 2023-11-09 NOTE — Assessment & Plan Note (Signed)
 Chronic, LDL goal is less than 70. Diabetic foot exam was performed.  He will continue with atorvastatin 20mg  daily. He will likely need metformin to help him reach optimal glycemic control.  He will continue with Tresiba 54 units nightly, Jardiance 25mg  daily, metformin 500mg  2 tabs po twice daily, and Ozempic 2mg  weekly. If A1c not less than 8, will add metformin, he is willing to take once daily.

## 2023-11-09 NOTE — Assessment & Plan Note (Signed)
 He has lateral ischemia noted on EKG. He denies having any cp, sob w/ exertion. Due to his cardiac risk factors, I will refer him to Cardiology for further evaluation. He is in agreement with treatment plan.

## 2023-11-09 NOTE — Assessment & Plan Note (Signed)
A full exam was performed.  DRE deferred.  He is advised to get 30-45 minutes of regular exercise, no less than four to five days per week. Both weight-bearing and aerobic exercises are recommended.  He is advised to follow a healthy diet with at least six fruits/veggies per day, decrease intake of red meat and other saturated fats and to increase fish intake to twice weekly.  Meats/fish should not be fried -- baked, boiled or broiled is preferable. It is also important to cut back on your sugar intake.  Be sure to read labels - try to avoid anything with added sugar, high fructose corn syrup or other sweeteners.  If you must use a sweetener, you can try stevia or monkfruit.  It is also important to avoid artificially sweetened foods/beverages and diet drinks. Lastly, wear SPF 50 sunscreen on exposed skin and when in direct sunlight for an extended period of time.  Be sure to avoid fast food restaurants and aim for at least 60 ounces of water daily.

## 2023-11-09 NOTE — Assessment & Plan Note (Signed)
 Chronic, fair control. Goal BP<120/80.  EKG performed, NSR w/ negative T-waves and lateral ischemia. He will continue with telmisartan 20mg  daily. Encouraged to follow low sodium diet. He will f/u in four months for re-evaluation.

## 2023-11-09 NOTE — Assessment & Plan Note (Signed)
 He is aware of 3lb weight gain since October 2024. He is encouraged to aim for at least 150 minutes of exercise per week.

## 2023-11-10 LAB — URINE CULTURE

## 2023-11-13 ENCOUNTER — Ambulatory Visit: Payer: Medicare HMO

## 2023-11-13 DIAGNOSIS — Z0000 Encounter for general adult medical examination without abnormal findings: Secondary | ICD-10-CM

## 2023-11-13 NOTE — Patient Instructions (Signed)
 Levi Roberts , Thank you for taking time to come for your Medicare Wellness Visit. I appreciate your ongoing commitment to your health goals. Please review the following plan we discussed and let me know if I can assist you in the future.   Referrals/Orders/Follow-Ups/Clinician Recommendations: none  This is a list of the screening recommended for you and due dates:  Health Maintenance  Topic Date Due   Flu Shot  11/18/2023*   COVID-19 Vaccine (4 - 2024-25 season) 11/29/2023*   Zoster (Shingles) Vaccine (1 of 2) 02/05/2024*   Hemoglobin A1C  05/07/2024   Eye exam for diabetics  07/01/2024   Yearly kidney function blood test for diabetes  11/04/2024   Yearly kidney health urinalysis for diabetes  11/04/2024   Complete foot exam   11/04/2024   Medicare Annual Wellness Visit  11/12/2024   Colon Cancer Screening  08/08/2026   DTaP/Tdap/Td vaccine (2 - Td or Tdap) 12/16/2028   Pneumonia Vaccine  Completed   Hepatitis C Screening  Completed   HPV Vaccine  Aged Out  *Topic was postponed. The date shown is not the original due date.    Advanced directives: (ACP Link)Information on Advanced Care Planning can be found at [facility] [place] Advance Health Care Directives Advance Health Care Directives. [email or web address]   Next Medicare Annual Wellness Visit scheduled for next year: Yes  insert Preventive Care attachment Insert FALL PREVENTION attachment if needed

## 2023-11-13 NOTE — Progress Notes (Signed)
 Subjective:   Levi Roberts is a 68 y.o. who presents for a Medicare Wellness preventive visit.  Visit Complete: Virtual I connected with  Levi Roberts on 11/13/23 by a audio enabled telemedicine application and verified that I am speaking with the correct person using two identifiers.  Patient Location: Home  Provider Location: Office/Clinic  I discussed the limitations of evaluation and management by telemedicine. The patient expressed understanding and agreed to proceed.  Vital Signs: Because this visit was a virtual/telehealth visit, some criteria may be missing or patient reported. Any vitals not documented were not able to be obtained and vitals that have been documented are patient reported.  VideoError- Librarian, academic were attempted between this provider and patient, however failed, due to patient having technical difficulties OR patient did not have access to video capability.  We continued and completed visit with audio only.   Persons Participating in Visit: Patient.  AWV Questionnaire: Yes: Patient Medicare AWV questionnaire was completed by the patient on 11/09/2023; I have confirmed that all information answered by patient is correct and no changes since this date.  Cardiac Risk Factors include: advanced age (>78men, >52 women);diabetes mellitus;dyslipidemia;hypertension;male gender     Objective:    Today's Vitals   There is no height or weight on file to calculate BMI.     11/13/2023    9:32 AM 10/29/2022    9:49 AM  Advanced Directives  Does Patient Have a Medical Advance Directive? No No  Would patient like information on creating a medical advance directive? No - Patient declined Yes (MAU/Ambulatory/Procedural Areas - Information given)    Current Medications (verified) Outpatient Encounter Medications as of 11/13/2023  Medication Sig   atorvastatin (LIPITOR) 20 MG tablet Take 1 tablet (20 mg total) by mouth  daily.   empagliflozin (JARDIANCE) 25 MG TABS tablet Take 1 tablet (25 mg total) by mouth daily.   finasteride (PROSCAR) 5 MG tablet Take 1 tablet (5 mg total) by mouth daily.   insulin degludec (TRESIBA FLEXTOUCH) 200 UNIT/ML FlexTouch Pen Inject 54 Units into the skin daily.   Insulin Pen Needle (NOVOFINE PEN NEEDLE) 32G X 6 MM MISC Use for daily insulin injections   metFORMIN (GLUCOPHAGE) 500 MG tablet Take 2 tablets (1,000 mg total) by mouth 2 (two) times daily with a meal.   tamsulosin (FLOMAX) 0.4 MG CAPS capsule Take 2 capsules (0.8 mg total) by mouth daily.   telmisartan (MICARDIS) 20 MG tablet TAKE 1 TABLET BY MOUTH DAILY.   No facility-administered encounter medications on file as of 11/13/2023.    Allergies (verified) Patient has no known allergies.   History: Past Medical History:  Diagnosis Date   Diabetes mellitus without complication (HCC)    High cholesterol    Hypertension    History reviewed. No pertinent surgical history. Family History  Problem Relation Age of Onset   Diabetes Mother    Heart attack Mother    Kidney cancer Father    Cancer Father    Diabetes Sister    Healthy Sister    Social History   Socioeconomic History   Marital status: Married    Spouse name: Not on file   Number of children: Not on file   Years of education: Not on file   Highest education level: Associate degree: occupational, Scientist, product/process development, or vocational program  Occupational History   Not on file  Tobacco Use   Smoking status: Never   Smokeless tobacco: Never   Tobacco comments:  Never started  Vaping Use   Vaping status: Never Used  Substance and Sexual Activity   Alcohol use: Never   Drug use: Never   Sexual activity: Yes    Birth control/protection: None  Other Topics Concern   Not on file  Social History Narrative   Not on file   Social Drivers of Health   Financial Resource Strain: Low Risk  (11/13/2023)   Overall Financial Resource Strain (CARDIA)     Difficulty of Paying Living Expenses: Not hard at all  Food Insecurity: No Food Insecurity (11/13/2023)   Hunger Vital Sign    Worried About Running Out of Food in the Last Year: Never true    Ran Out of Food in the Last Year: Never true  Transportation Needs: No Transportation Needs (11/13/2023)   PRAPARE - Administrator, Civil Service (Medical): No    Lack of Transportation (Non-Medical): No  Physical Activity: Sufficiently Active (11/13/2023)   Exercise Vital Sign    Days of Exercise per Week: 5 days    Minutes of Exercise per Session: 60 min  Stress: No Stress Concern Present (11/13/2023)   [employer] of Occupational Health - Occupational Stress Questionnaire    Feeling of Stress : Not at all  Social Connections: Moderately Integrated (11/13/2023)   Social Connection and Isolation Panel [NHANES]    Frequency of Communication with Friends and Family: More than three times a week    Frequency of Social Gatherings with Friends and Family: More than three times a week    Attends Religious Services: More than 4 times per year    Active Member of [employer] or Organizations: No    Attends Engineer, structural: Never    Marital Status: Married    Tobacco Counseling Counseling given: Not Answered Tobacco comments: Never started    Clinical Intake:  Pre-visit preparation completed: Yes  Pain : No/denies pain     Nutritional Risks: None Diabetes: Yes CBG done?: No Did pt. bring in CBG monitor from home?: No  Lab Results  Component Value Date   HGBA1C 8.7 (H) 11/05/2023   HGBA1C 8.5 (H) 08/12/2023   HGBA1C 8.9 (H) 06/10/2023     How often do you need to have someone help you when you read instructions, pamphlets, or other written materials from your doctor or pharmacy?: 1 - Never  Interpreter Needed?: No  Information entered by :: NAllen LPN   Activities of Daily Living     11/09/2023    9:50 PM  In your present state of health, do you have any  difficulty performing the following activities:  Hearing? 0  Vision? 0  Difficulty concentrating or making decisions? 0  Walking or climbing stairs? 0  Dressing or bathing? 0  Doing errands, shopping? 0  Preparing Food and eating ? N  Using the Toilet? N  In the past six months, have you accidently leaked urine? N  Do you have problems with loss of bowel control? N  Managing your Medications? N  Managing your Finances? N  Housekeeping or managing your Housekeeping? N    Patient Care Team: Dorothyann Peng, MD as PCP - General (Internal Medicine) Alden Hipp, RPH-CPP (Pharmacist) Genelle Gather, OD (Optometry)  Indicate any recent Medical Services you may have received from other than [facility] providers in the past year (date may be approximate).     Assessment:   This is a routine wellness examination for [facility].  Hearing/Vision screen Hearing Screening -  Comments:: Denies hearing issues Vision Screening - Comments:: Regular eye exams, [facility]   Goals Addressed             This Visit's Progress    Patient Stated       11/13/2023, wants to lose weight, get under 300 pounds       Depression Screen     11/13/2023    9:33 AM 11/05/2023   10:29 AM 06/10/2023    8:35 AM 03/04/2023    8:45 AM 01/29/2023    3:34 PM 10/29/2022    9:13 AM 10/29/2022    9:08 AM  PHQ 2/9 Scores  PHQ - 2 Score 0 0 0 0 0 0 0  PHQ- 9 Score 0 0 0 0 0      Fall Risk     11/09/2023    9:50 PM 11/05/2023   10:29 AM 06/10/2023    8:34 AM 03/04/2023    8:45 AM 01/29/2023    3:33 PM  Fall Risk   Falls in the past year? 0 0 0 0 0  Number falls in past yr: 0 0 0 0 0  Injury with Fall? 0 0 0 0 0  Risk for fall due to : Medication side effect No Fall Risks No Fall Risks No Fall Risks No Fall Risks  Follow up Falls prevention discussed;Falls evaluation completed Falls evaluation completed Falls evaluation completed Falls evaluation completed Falls evaluation completed    MEDICARE RISK AT  HOME:  Medicare Risk at Home Any stairs in or around the home?: (Patient-Rptd) Yes If so, are there any without handrails?: (Patient-Rptd) No Home free of loose throw rugs in walkways, pet beds, electrical cords, etc?: (Patient-Rptd) Yes Adequate lighting in your home to reduce risk of falls?: (Patient-Rptd) Yes Life alert?: (Patient-Rptd) No Use of a cane, walker or w/c?: (Patient-Rptd) No Grab bars in the bathroom?: (Patient-Rptd) No Shower chair or bench in shower?: (Patient-Rptd) No Elevated toilet seat or a handicapped toilet?: (Patient-Rptd) No  TIMED UP AND GO:  Was the test performed?  No  Cognitive Function: 6CIT completed        11/13/2023    9:34 AM 10/29/2022   10:08 AM 10/29/2022    9:53 AM  6CIT Screen  What Year? 0 points 0 points 0 points  What month? 0 points 0 points 0 points  What time? 0 points  0 points  Count back from 20 0 points  0 points  Months in reverse 0 points  0 points  Repeat phrase 0 points  0 points  Total Score 0 points  0 points    Immunizations Immunization History  Administered Date(s) Administered   PFIZER(Purple Top)SARS-COV-2 Vaccination 11/01/2019, 11/21/2019, 08/06/2020   PNEUMOCOCCAL CONJUGATE-20 11/05/2023   Tdap 12/17/2018    Screening Tests Health Maintenance  Topic Date Due   INFLUENZA VACCINE  11/18/2023 (Originally 03/21/2023)   COVID-19 Vaccine (4 - 2024-25 season) 11/29/2023 (Originally 04/21/2023)   Zoster Vaccines- Shingrix (1 of 2) 02/05/2024 (Originally 07/20/1976)   HEMOGLOBIN A1C  05/07/2024   OPHTHALMOLOGY EXAM  07/01/2024   Diabetic kidney evaluation - eGFR measurement  11/04/2024   Diabetic kidney evaluation - Urine ACR  11/04/2024   FOOT EXAM  11/04/2024   Medicare Annual Wellness (AWV)  11/12/2024   Colonoscopy  08/08/2026   DTaP/Tdap/Td (2 - Td or Tdap) 12/16/2028   Pneumonia Vaccine 64+ Years old  Completed   Hepatitis C Screening  Completed   HPV VACCINES  Aged Out  Health Maintenance  There are  no preventive care reminders to display for this patient.  Health Maintenance Items Addressed: Up to date  Additional Screening:  Vision Screening: Recommended annual ophthalmology exams for early detection of glaucoma and other disorders of the eye.  Dental Screening: Recommended annual dental exams for proper oral hygiene  Community Resource Referral / Chronic Care Management: CRR required this visit?  No   CCM required this visit?  No     Plan:     I have personally reviewed and noted the following in the patient's chart:   Medical and social history Use of alcohol, tobacco or illicit drugs  Current medications and supplements including opioid prescriptions. Patient is not currently taking opioid prescriptions. Functional ability and status Nutritional status Physical activity Advanced directives List of other physicians Hospitalizations, surgeries, and ER visits in previous 12 months Vitals Screenings to include cognitive, depression, and falls Referrals and appointments  In addition, I have reviewed and discussed with patient certain preventive protocols, quality metrics, and best practice recommendations. A written personalized care plan for preventive services as well as general preventive health recommendations were provided to patient.     Barb Merino, LPN   1/61/0960   After Visit Summary: (MyChart) Due to this being a telephonic visit, the after visit summary with patients personalized plan was offered to patient via MyChart   Notes: Nothing significant to report at this time.

## 2023-11-14 ENCOUNTER — Telehealth: Payer: Self-pay | Admitting: Pharmacist

## 2023-11-14 DIAGNOSIS — E1169 Type 2 diabetes mellitus with other specified complication: Secondary | ICD-10-CM

## 2023-11-14 NOTE — Progress Notes (Signed)
 11/14/2023 Name: Levi Roberts MRN: [id number] DOB: 11-03-1955  Chief Complaint  Patient presents with   Medication Management    Diabetes     Levi Roberts is a 68 y.o. year old male who presented for a telephone visit.   They were referred to the pharmacist by their PCP for assistance in managing diabetes.    Subjective:  Care Team: Primary Care Provider: Dorothyann Peng, MD ; Next Scheduled Visit: 02/10/24  Medication Access/Adherence  Current Pharmacy:  [facility] - [place], [place] - [address] [place] [address] Phone: [phone] Fax: [phone]  [facility] [address] [place]), [place] - [address] [place] [address] Phone: [phone] Fax: [phone]   Patient reports affordability concerns with their medications: Yes --Lynnell Chad, and Ozempic are all being provided through Patient Assistance Programs Patient reports access/transportation concerns to their pharmacy: No  Patient reports adherence concerns with their medications:  Yes  Cost   Diabetes:  Current medications:  Ozempic 2 mg weekly Jardiance 25 mg 1 tablet daily Tresiba 54  units daily  Current glucose readings: Anm blood sugar today 104 mg/dl   Patient denies hypoglycemic s/sx including  dizziness, shakiness, sweating. Patient denies hyperglycemic symptoms including  polyuria, polydipsia, polyphagia, nocturia, neuropathy, blurred vision.   Current medication access support: Patient assistance proramming for Ozempic, Tresiba, and Jardiance   Hypertension: Telmisartan 20 mg 1 tablet daily    Hyperlipidemia/ASCVD Risk Reduction  Current lipid lowering medications:  Atorvastatin 20 mg    Objective:  Lab Results  Component Value Date   HGBA1C 8.7 (H) 11/05/2023    Lab Results  Component Value Date   CREATININE 1.08 11/05/2023   BUN 15 11/05/2023   NA 137  11/05/2023   K 5.0 11/05/2023   CL 102 11/05/2023   CO2 21 11/05/2023    Lab Results  Component Value Date   CHOL 114 11/05/2023   HDL 34 (L) 11/05/2023   LDLCALC 67 11/05/2023   TRIG 60 11/05/2023   CHOLHDL 3.4 11/05/2023    Medications Reviewed Today     Reviewed by Beecher Mcardle, RPH (Pharmacist) on 11/14/23 at 1027  Med List Status: <None>   Medication Order Taking? Sig Documenting Provider Last Dose Status Informant  atorvastatin (LIPITOR) 20 MG tablet [id number] Yes Take 1 tablet (20 mg total) by mouth daily. Dorothyann Peng, MD Taking Active   empagliflozin (JARDIANCE) 25 MG TABS tablet [id number] Yes Take 1 tablet (25 mg total) by mouth daily. Dorothyann Peng, MD Taking Active   finasteride (PROSCAR) 5 MG tablet [id number] Yes Take 1 tablet (5 mg total) by mouth daily. Stoioff, Verna Czech, MD Taking Active   insulin degludec (TRESIBA FLEXTOUCH) 200 UNIT/ML FlexTouch Pen [id number] Yes Inject 54 Units into the skin daily. Dorothyann Peng, MD Taking Active   Insulin Pen Needle (NOVOFINE PEN NEEDLE) 32G X 6 MM MISC [id number] Yes Use for daily insulin injections Dorothyann Peng, MD Taking Active   metFORMIN (GLUCOPHAGE) 500 MG tablet [id number] Yes Take 2 tablets (1,000 mg total) by mouth 2 (two) times daily with a meal. Dorothyann Peng, MD Taking Active   [facility], 2 MG/DOSE, 8 MG/3ML SOPN [id number] Yes Inject 2 mg into the skin once a week. [provider] Taking Active   tamsulosin (FLOMAX) 0.4 MG CAPS capsule [id number] Yes Take 2 capsules (0.8 mg total) by mouth daily. Riki Altes, MD Taking Active   telmisartan (  MICARDIS) 20 MG tablet [id number] Yes TAKE 1 TABLET BY MOUTH DAILY. Dorothyann Peng, MD Taking Active               Assessment/Plan:   Diabetes: - Currently uncontrolled A1c 8.7%--Patient was out of medications periodically.  Confirmed today he has everything.  He was given a coupon to get [place] and Ozempic at his local pharmacy and his shipment from [facility]  was picked up on 11/05/23 - Recommend to continue current therapy.   Hypertension: - Currently controlled 124/78 11/05/23 - Recommend to continue current therapy      Hyperlipidemia/ASCVD Risk Reduction: - Currently controlled. LDL 67 -- Recommend to continue current therapy   Follow Up Plan:    Follow up with Patient in 1 month.  Beecher Mcardle, PharmD, BCACP Clinical Pharmacist [phone]

## 2023-11-18 ENCOUNTER — Other Ambulatory Visit: Payer: Self-pay | Admitting: Pharmacist

## 2024-02-10 ENCOUNTER — Encounter: Payer: Self-pay | Admitting: Internal Medicine

## 2024-02-10 ENCOUNTER — Telehealth: Payer: Self-pay | Admitting: Pharmacist

## 2024-02-10 ENCOUNTER — Ambulatory Visit (INDEPENDENT_AMBULATORY_CARE_PROVIDER_SITE_OTHER): Admitting: Internal Medicine

## 2024-02-10 VITALS — BP 132/78 | HR 96 | Temp 98.3°F | Ht 75.0 in | Wt 316.8 lb

## 2024-02-10 DIAGNOSIS — E66812 Obesity, class 2: Secondary | ICD-10-CM

## 2024-02-10 DIAGNOSIS — I10 Essential (primary) hypertension: Secondary | ICD-10-CM | POA: Diagnosis not present

## 2024-02-10 DIAGNOSIS — E1169 Type 2 diabetes mellitus with other specified complication: Secondary | ICD-10-CM

## 2024-02-10 DIAGNOSIS — E785 Hyperlipidemia, unspecified: Secondary | ICD-10-CM

## 2024-02-10 DIAGNOSIS — Z6839 Body mass index (BMI) 39.0-39.9, adult: Secondary | ICD-10-CM | POA: Diagnosis not present

## 2024-02-10 DIAGNOSIS — Z79899 Other long term (current) drug therapy: Secondary | ICD-10-CM

## 2024-02-10 MED ORDER — SYNJARDY XR 12.5-1000 MG PO TB24
2.0000 | ORAL_TABLET | Freq: Every day | ORAL | Status: DC
Start: 2024-02-10 — End: 2024-07-01

## 2024-02-10 NOTE — Progress Notes (Signed)
 I,Victoria T Emmitt, CMA,acting as a Neurosurgeon for Catheryn LOISE Slocumb, MD.,have documented all relevant documentation on the behalf of Catheryn LOISE Slocumb, MD,as directed by  Catheryn LOISE Slocumb, MD while in the presence of Catheryn LOISE Slocumb, MD.  Subjective:  Patient ID: Levi Roberts , male    DOB: 1956/08/14 , 68 y.o.   MRN: [id number]  Chief Complaint  Patient presents with   Diabetes    Patient presents today for dm & bpc. He reports compliance with medications. Denies headache, chest pain & sob.   Hypertension    HPI Discussed the use of AI scribe software for clinical note transcription with the patient, who gave verbal consent to proceed.  History of Present Illness MIRZA FESSEL is a 68 year old male with diabetes and hypertension who presents for a diabetes and blood pressure check.  He is monitoring his blood glucose levels using a True Manager meter, with readings within range 48% of the time. His blood sugar was 150 mg/dL last Monday, and postprandial sugars are rising into the 200s. He is currently on Ozempic  2 mg, metformin  twice daily, Tresiba  54 units, and Jardiance , which he takes in the morning with metformin . He has encountered issues with receiving Jardiance  from the patient assistance/pharmacy and has been sharing his wife's medication.  He has a history of hypertension, with a current blood pressure reading of 132/78 mmHg. He acknowledges exercising 'a little bit' but not as much as he should, noting the challenge of exercising in the heat.   Diabetes He presents for his follow-up diabetic visit. He has type 2 diabetes mellitus. His disease course has been stable. There are no hypoglycemic associated symptoms. Pertinent negatives for hypoglycemia include no dizziness or headaches. Pertinent negatives for diabetes include no blurred vision, no polydipsia, no polyphagia and no polyuria. There are no hypoglycemic complications. Risk factors for coronary artery disease  include diabetes mellitus, dyslipidemia, hypertension, male sex, obesity and sedentary lifestyle. He is compliant with treatment some of the time. He is following a diabetic diet. He participates in exercise intermittently. His breakfast blood glucose is taken between 8-9 am. His breakfast blood glucose range is generally 130-140 mg/dl. An ACE inhibitor/angiotensin II receptor blocker is being taken.  Hypertension This is a chronic problem. The current episode started more than 1 year ago. The problem has been gradually improving since onset. The problem is controlled. Pertinent negatives include no blurred vision or headaches. Risk factors for coronary artery disease include diabetes mellitus, dyslipidemia, obesity and sedentary lifestyle. Past treatments include angiotensin blockers. The current treatment provides moderate improvement. Compliance problems include exercise.      Past Medical History:  Diagnosis Date   Diabetes mellitus without complication (HCC)    High cholesterol    Hypertension      Family History  Problem Relation Age of Onset   Diabetes Mother    Heart attack Mother    Kidney cancer Father    Cancer Father    Diabetes Sister    Healthy Sister      Current Outpatient Medications:    atorvastatin  (LIPITOR) 20 MG tablet, Take 1 tablet (20 mg total) by mouth daily., Disp: 90 tablet, Rfl: 2   Empagliflozin -metFORMIN  HCl ER (SYNJARDY  XR) 12.12-998 MG TB24, Take 2 tablets by mouth daily., Disp: , Rfl:    finasteride  (PROSCAR ) 5 MG tablet, Take 1 tablet (5 mg total) by mouth daily., Disp: 90 tablet, Rfl: 3   insulin  degludec (TRESIBA  FLEXTOUCH) 200 UNIT/ML FlexTouch  Pen, Inject 54 Units into the skin daily., Disp: 9 mL, Rfl: 1   Insulin  Pen Needle (NOVOFINE PEN NEEDLE) 32G X 6 MM MISC, Use for daily insulin  injections, Disp: 100 each, Rfl: 1   OZEMPIC , 2 MG/DOSE, 8 MG/3ML SOPN, Inject 2 mg into the skin once a week., Disp: , Rfl:    tamsulosin  (FLOMAX ) 0.4 MG CAPS  capsule, Take 2 capsules (0.8 mg total) by mouth daily., Disp: 60 capsule, Rfl: 11   telmisartan  (MICARDIS ) 20 MG tablet, TAKE 1 TABLET BY MOUTH DAILY., Disp: 90 tablet, Rfl: 3   No Known Allergies   Review of Systems  Constitutional: Negative.   HENT: Negative.    Eyes:  Negative for blurred vision.  Respiratory: Negative.    Cardiovascular: Negative.   Endocrine: Negative.  Negative for polydipsia, polyphagia and polyuria.  Skin: Negative.   Allergic/Immunologic: Negative.   Neurological: Negative.  Negative for dizziness and headaches.  Hematological: Negative.      Today's Vitals   02/10/24 1146  BP: 132/78  Pulse: 96  Temp: 98.3 F (36.8 C)  SpO2: 98%  Weight: (!) 316 lb 12.8 oz (143.7 kg)  Height: 6' 3 (1.905 m)   Body mass index is 39.6 kg/m.  Wt Readings from Last 3 Encounters:  02/10/24 (!) 316 lb 12.8 oz (143.7 kg)  11/05/23 (!) 319 lb 6.4 oz (144.9 kg)  06/10/23 (!) 316 lb 6.4 oz (143.5 kg)     Objective:  Physical Exam Vitals and nursing note reviewed.  Constitutional:      Appearance: Normal appearance. He is obese.  HENT:     Head: Normocephalic and atraumatic.   Eyes:     Extraocular Movements: Extraocular movements intact.    Cardiovascular:     Rate and Rhythm: Normal rate and regular rhythm.     Heart sounds: Normal heart sounds.  Pulmonary:     Effort: Pulmonary effort is normal.     Breath sounds: Normal breath sounds.   Musculoskeletal:     Cervical back: Normal range of motion.   Skin:    General: Skin is warm.   Neurological:     General: No focal deficit present.     Mental Status: He is alert.   Psychiatric:        Mood and Affect: Mood normal.         Assessment And Plan:  Dyslipidemia associated with type 2 diabetes mellitus (HCC) Assessment & Plan: Blood sugar control suboptimal with hyperglycemia. LDL goal is less than 70, c/w statin therapy. BS log reviewed. Postprandial glucose elevated. Medication adherence  issue due to pharmacy problems with Jardiance . Discussed dietary modifications to manage glucose levels. Considered Synjardy  to reduce pill burden. - Check Jardiance  prescription with pharmacy and patient assistance program. - I will switch him to Synjardy  XR 12.5/1000mg  twice daily to reduce his pill burden. - He is aware to STOP metformin  because it is in Synjardy  - [facility] pharmacist contacted to update patient assistance for Synjardy , he was given samples to start tonite.  - Order A1c test. - Advise to reduce carbohydrate intake at dinner. - Encourage to send blood sugar readings via MyChart.  Orders: -     Hemoglobin A1c -     BMP8+EGFR  Essential hypertension Assessment & Plan: Chronic, fair control. Goal BP<120/80.  He will continue with telmisartan  20mg  daily for now. May need to add amlodipine at night. Encouraged to follow low sodium diet. He will f/u in four months for re-evaluation.  Class 2 severe obesity due to excess calories with serious comorbidity and body mass index (BMI) of 39.0 to 39.9 in adult [facility]) Assessment & Plan:  He is encouraged to aim for at least 150 minutes of exercise per week. He is encouraged to aim to lose ten percent of his body weight to decrease cardiac risk.    Other orders -     Synjardy  XR; Take 2 tablets by mouth daily.   Return for 3 MONTH DM F/U.SABRA  Patient was given opportunity to ask questions. Patient verbalized understanding of the plan and was able to repeat key elements of the plan. All questions were answered to their satisfaction.    I, Catheryn LOISE Slocumb, MD, have reviewed all documentation for this visit. The documentation on 02/10/24 for the exam, diagnosis, procedures, and orders are all accurate and complete.   IF YOU HAVE BEEN REFERRED TO A SPECIALIST, IT MAY TAKE 1-2 WEEKS TO SCHEDULE/PROCESS THE REFERRAL. IF YOU HAVE NOT HEARD FROM US /SPECIALIST IN TWO WEEKS, PLEASE GIVE US  A CALL AT [phone].   THE PATIENT IS  ENCOURAGED TO PRACTICE SOCIAL DISTANCING DUE TO THE COVID-19 PANDEMIC.

## 2024-02-10 NOTE — Progress Notes (Signed)
   02/10/2024  Patient ID: Levi Roberts, male   DOB: 13-Mar-1956, 68 y.o.   MRN: [id number]  Patient was called regarding medication assistance. He was seen today by the PCP and expressed concern over Jardiance . Patient Assistance paperwork was completed for Jardiance  earlier this year. The company requested verification of the Patient's income. He said he emailed this information to someone but he is not sure who.  There is documentation that the income information was requested but no documentation that it was received.    Patient is now being switched to Synjardy  which is provided through the same company [employer]) that offers Jardiance .  A note will be sent to the RX Medication Assistance Team to send the Patient a new application with a request attached for his W2 or tax return information.  Plan:  Follow up in 2 weeks. Review HgA1c from today  Cassius DOROTHA Brought, PharmD, [facility] Clinical Pharmacist [phone]

## 2024-02-10 NOTE — Patient Instructions (Signed)

## 2024-02-10 NOTE — Assessment & Plan Note (Addendum)
 Blood sugar control suboptimal with hyperglycemia. LDL goal is less than 70, c/w statin therapy. BS log reviewed. Postprandial glucose elevated. Medication adherence issue due to pharmacy problems with Jardiance . Discussed dietary modifications to manage glucose levels. Considered Synjardy  to reduce pill burden. - Check Jardiance  prescription with pharmacy and patient assistance program. - I will switch him to Synjardy  XR 12.5/1000mg  twice daily to reduce his pill burden. - He is aware to STOP metformin  because it is in Synjardy  - [facility] pharmacist contacted to update patient assistance for Synjardy , he was given samples to start tonite.  - Order A1c test. - Advise to reduce carbohydrate intake at dinner. - Encourage to send blood sugar readings via MyChart.

## 2024-02-10 NOTE — Assessment & Plan Note (Signed)
 He is encouraged to aim for at least 150 minutes of exercise per week. He is encouraged to aim to lose ten percent of his body weight to decrease cardiac risk.

## 2024-02-10 NOTE — Assessment & Plan Note (Signed)
 Chronic, fair control. Goal BP<120/80.  He will continue with telmisartan  20mg  daily for now. May need to add amlodipine at night. Encouraged to follow low sodium diet. He will f/u in four months for re-evaluation.

## 2024-02-11 ENCOUNTER — Telehealth: Payer: Self-pay

## 2024-02-11 LAB — HEMOGLOBIN A1C
Est. average glucose Bld gHb Est-mCnc: 206 mg/dL
Hgb A1c MFr Bld: 8.8 % — ABNORMAL HIGH (ref 4.8–5.6)

## 2024-02-11 LAB — BMP8+EGFR
BUN/Creatinine Ratio: 15 (ref 10–24)
BUN: 16 mg/dL (ref 8–27)
CO2: 20 mmol/L (ref 20–29)
Calcium: 9.3 mg/dL (ref 8.6–10.2)
Chloride: 100 mmol/L (ref 96–106)
Creatinine, Ser: 1.1 mg/dL (ref 0.76–1.27)
Glucose: 75 mg/dL (ref 70–99)
Potassium: 4.6 mmol/L (ref 3.5–5.2)
Sodium: 137 mmol/L (ref 134–144)
eGFR: 74 mL/min/{1.73_m2} (ref >59)

## 2024-02-11 NOTE — Telephone Encounter (Signed)
 PAP: Patient assistance application for Synjardy  through [employer]) has been mailed to pt's home address on file. Provider portion of application will be faxed to provider's office.  For SynJardy  XR

## 2024-02-12 ENCOUNTER — Telehealth: Payer: Self-pay | Admitting: Pharmacist

## 2024-02-12 DIAGNOSIS — E1169 Type 2 diabetes mellitus with other specified complication: Secondary | ICD-10-CM

## 2024-02-12 NOTE — Progress Notes (Signed)
   02/12/2024  Patient ID: Levi Roberts Breed, male   DOB: 1956/01/28, 68 y.o.   MRN: [id number]  Patient was recently switched to Synjardy .  The Provider portion of his application was sent to the office for signature.  The application was printed and signed by Dr. Jarold.  Application was faxed back to Luke Mall, CPhT for scanning into the patient's chart and for her to complete the patient assistance process.   Cassius DOROTHA Brought, PharmD, BCACP Clinical Pharmacist [phone]

## 2024-02-16 ENCOUNTER — Ambulatory Visit: Payer: Self-pay | Admitting: Internal Medicine

## 2024-02-24 ENCOUNTER — Other Ambulatory Visit: Payer: Self-pay

## 2024-02-24 ENCOUNTER — Ambulatory Visit: Attending: Cardiology | Admitting: Cardiology

## 2024-02-24 ENCOUNTER — Encounter: Payer: Self-pay | Admitting: Cardiology

## 2024-02-24 ENCOUNTER — Telehealth: Payer: Self-pay | Admitting: Radiology

## 2024-02-24 VITALS — BP 142/76 | HR 85 | Ht 75.0 in | Wt 317.2 lb

## 2024-02-24 DIAGNOSIS — E785 Hyperlipidemia, unspecified: Secondary | ICD-10-CM | POA: Diagnosis not present

## 2024-02-24 DIAGNOSIS — I10 Essential (primary) hypertension: Secondary | ICD-10-CM

## 2024-02-24 DIAGNOSIS — E1169 Type 2 diabetes mellitus with other specified complication: Secondary | ICD-10-CM | POA: Diagnosis not present

## 2024-02-24 DIAGNOSIS — I517 Cardiomegaly: Secondary | ICD-10-CM | POA: Diagnosis not present

## 2024-02-24 DIAGNOSIS — R9431 Abnormal electrocardiogram [ECG] [EKG]: Secondary | ICD-10-CM | POA: Diagnosis not present

## 2024-02-24 DIAGNOSIS — Z79899 Other long term (current) drug therapy: Secondary | ICD-10-CM

## 2024-02-24 DIAGNOSIS — R0683 Snoring: Secondary | ICD-10-CM | POA: Diagnosis not present

## 2024-02-24 MED ORDER — HYDROCHLOROTHIAZIDE 12.5 MG PO CAPS
12.5000 mg | ORAL_CAPSULE | Freq: Every day | ORAL | 3 refills | Status: AC
Start: 2024-02-24 — End: 2024-07-07

## 2024-02-24 MED ORDER — TELMISARTAN 20 MG PO TABS: 20.0000 mg | ORAL_TABLET | Freq: Every day | ORAL | 3 refills | Status: AC

## 2024-02-24 MED ORDER — METOPROLOL TARTRATE 100 MG PO TABS: 100.0000 mg | ORAL_TABLET | Freq: Once | ORAL | 0 refills | Status: DC

## 2024-02-24 NOTE — Progress Notes (Signed)
 Cardiology CONSULT Note    Date:  02/24/2024   ID:  Levi Roberts, DOB [birth year], MRN [id number]  PCP:  Jarold Medici, MD  Cardiologist:  Wilbert Bihari, MD   Chief Complaint  Patient presents with   New Patient (Initial Visit)    Abnormal EKG and cardiac risk factors for CAD    Patient Profile: Levi Roberts is a 68 y.o. male who is being seen today for the evaluation of abnormal EKG at the request of Jarold Medici, MD.  History of Present Illness:  Levi Roberts is a 68 y.o. male who is being seen today for the evaluation of abnormal EKG at the request of Jarold Medici, MD.  This is a 68yo male with a hx of DM, HLD, morbid obesity and HTN.  He also has a fm hx of CAD with his mom having an MI.  He has never smoked.  He is referred for abnormal EKG 11/05/2023 showing normal sinus rhythm with possible LVH by voltage criteria and T wave inversions in leads I and aVL.  He is now referred for cardiac workup. He denies any chest pain or pressure, SOB, DOE, PND, orthopnea, dizziness, palpitations or syncope. Occasionally he will have some LE edema related to a remote ankle injury on the right. He is compliant with his meds and is tolerating meds with no SE.    Past Medical History:  Diagnosis Date   Diabetes mellitus without complication (HCC)    High cholesterol    Hypertension     History reviewed. No pertinent surgical history.  Current Medications: Current Meds  Medication Sig   atorvastatin  (LIPITOR) 20 MG tablet Take 1 tablet (20 mg total) by mouth daily.   Empagliflozin -metFORMIN  HCl ER (SYNJARDY  XR) 12.12-998 MG TB24 Take 2 tablets by mouth daily.   finasteride  (PROSCAR ) 5 MG tablet Take 1 tablet (5 mg total) by mouth daily.   insulin  degludec (TRESIBA  FLEXTOUCH) 200 UNIT/ML FlexTouch Pen Inject 54 Units into the skin daily.   Insulin  Pen Needle (NOVOFINE PEN NEEDLE) 32G X 6 MM MISC Use for daily insulin  injections   OZEMPIC , 2 MG/DOSE, 8 MG/3ML SOPN  Inject 2 mg into the skin once a week.   tamsulosin  (FLOMAX ) 0.4 MG CAPS capsule Take 2 capsules (0.8 mg total) by mouth daily.   telmisartan  (MICARDIS ) 20 MG tablet TAKE 1 TABLET BY MOUTH DAILY.    Allergies:   Patient has no known allergies.   Social History   Socioeconomic History   Marital status: Married    Spouse name: Not on file   Number of children: Not on file   Years of education: Not on file   Highest education level: Associate degree: occupational, Scientist, product/process development, or vocational program  Occupational History   Not on file  Tobacco Use   Smoking status: Never   Smokeless tobacco: Never   Tobacco comments:    Never started  Vaping Use   Vaping status: Never Used  Substance and Sexual Activity   Alcohol use: Never   Drug use: Never   Sexual activity: Yes    Birth control/protection: None  Other Topics Concern   Not on file  Social History Narrative   Not on file   Social Drivers of Health   Financial Resource Strain: Low Risk  (02/06/2024)   Overall Financial Resource Strain (CARDIA)    Difficulty of Paying Living Expenses: Not very hard  Food Insecurity: No Food Insecurity (02/06/2024)   Hunger Vital Sign  Worried About Programme researcher, broadcasting/film/video in the Last Year: Never true    Ran Out of Food in the Last Year: Never true  Transportation Needs: No Transportation Needs (02/06/2024)   PRAPARE - Administrator, Civil Service (Medical): No    Lack of Transportation (Non-Medical): No  Physical Activity: Sufficiently Active (02/06/2024)   Exercise Vital Sign    Days of Exercise per Week: 5 days    Minutes of Exercise per Session: 40 min  Stress: No Stress Concern Present (02/06/2024)   [employer] of Occupational Health - Occupational Stress Questionnaire    Feeling of Stress: Not at all  Social Connections: Socially Integrated (02/06/2024)   Social Connection and Isolation Panel    Frequency of Communication with Friends and Family: More than three times  a week    Frequency of Social Gatherings with Friends and Family: More than three times a week    Attends Religious Services: More than 4 times per year    Active Member of [employer] or Organizations: Yes    Attends Engineer, structural: More than 4 times per year    Marital Status: Married     Family History:  The patient's family history includes Cancer in his father; Diabetes in his mother and sister; Healthy in his sister; Heart attack (age of onset: 13) in his mother; Kidney cancer in his father.   ROS:   Please see the history of present illness.    ROS All other systems reviewed and are negative.     02/20/2024    1:43 PM  PAD Screen  Previous PAD dx? No  Previous surgical procedure? No  Pain with walking? No  Feet/toe relief with dangling? No  Painful, non-healing ulcers? No  Extremities discolored? No       PHYSICAL EXAM:   VS:  BP (!) 142/76   Pulse 85   Ht 6' 3 (1.905 m)   Wt (!) 317 lb 3.2 oz (143.9 kg)   SpO2 95%   BMI 39.65 kg/m    GEN: Well nourished, well developed, in no acute distress  HEENT: normal  Neck: no JVD, carotid bruits, or masses Cardiac: RRR; no murmurs, rubs, or gallops.  1+ RLE edema.  Intact distal pulses bilaterally.  Respiratory:  clear to auscultation bilaterally, normal work of breathing GI: soft, nontender, nondistended, + BS MS: no deformity or atrophy  Skin: warm and dry, no rash Neuro:  Alert and Oriented x 3, Strength and sensation are intact Psych: euthymic mood, full affect  Wt Readings from Last 3 Encounters:  02/24/24 (!) 317 lb 3.2 oz (143.9 kg)  02/10/24 (!) 316 lb 12.8 oz (143.7 kg)  11/05/23 (!) 319 lb 6.4 oz (144.9 kg)      Studies/Labs Reviewed:   EKG Interpretation Date/Time:  Monday February 24 2024 08:37:05 EDT Ventricular Rate:  85 PR Interval:  170 QRS Duration:  90 QT Interval:  352 QTC Calculation: 418 R Axis:   -12  Text Interpretation: Normal sinus rhythm Minimal voltage criteria for LVH, may  be normal variant ( R in aVL ) Cannot rule out Anterior infarct , age undetermined T wave abnormality, consider lateral ischemia No previous ECGs available Confirmed by Shlomo Corning [phone]) on 02/24/2024 8:46:54 AM EKG Interpretation Date/Time:  Monday February 24 2024 08:37:05 EDT Ventricular Rate:  85 PR Interval:  170 QRS Duration:  90 QT Interval:  352 QTC Calculation: 418 R Axis:   -12  Text Interpretation: Normal sinus  rhythm Minimal voltage criteria for LVH, may be normal variant ( R in aVL ) Cannot rule out Anterior infarct , age undetermined T wave abnormality, consider lateral ischemia No previous ECGs available Confirmed by Shlomo Corning [phone]) on 02/24/2024 8:46:54 AM       Recent Labs: 11/05/2023: ALT 11; Hemoglobin 13.0; Platelets 248 02/10/2024: BUN 16; Creatinine, Ser 1.10; Potassium 4.6; Sodium 137   Lipid Panel    Component Value Date/Time   CHOL 114 11/05/2023 1126   TRIG 60 11/05/2023 1126   HDL 34 (L) 11/05/2023 1126   CHOLHDL 3.4 11/05/2023 1126   LDLCALC 67 11/05/2023 1126      Additional studies/ records that were reviewed today include:  PCP office visit notes and EKG from 11/05/2023    ASSESSMENT:    1. Nonspecific abnormal electrocardiogram (ECG) (EKG)   2. Abnormal EKG   3. Essential hypertension   4. Dyslipidemia associated with type 2 diabetes mellitus (HCC)      PLAN:  In order of problems listed above:  #Abnormal EKG #Diabetes mellitus -EKG demonstrates T wave inversions in leads I and aVL -This may review related to LVH with repolarization abnormality -Will get a 2D echo to look for wall motion abnormalities and assess EF and look for LVH -Really has cardiac risk factors including hypertension, hyperlipidemia and diabetes as well as a family history of CAD -Given his diabetes he may not have typical anginal symptoms -Given his abnormal EKG and cardiac risk factors I recommended coronary CTA to define coronary anatomy and rule out  CAD  #Hypertension - BP borderline controlled on exam today - BP goal is 130/70 or less - Change Micardis  to Micardis  HCTZ 20-12.5 mg daily - Check Bmet in 1 week - Nurse visit in 1 week for BP check  #Hyperlipidemia - LDL goal less than 100 and preferably less than 70 given his diabetes and other cardiac risk factors - I have personally reviewed and interpreted outside labs performed by patient's PCP which showed LDL 67, HDL 34 and triglycerides 60 on 11/05/2023 as well as ALT 11 - Continue atorvastatin  20 mg daily with as needed refills  #Snoring -His Stop Bang score is 6 -this may be driving his HTN -check Itamar HSR  Time Spent: 20 minutes total time of encounter, including 15 minutes spent in face-to-face patient care on the date of this encounter. This time includes coordination of care and counseling regarding above mentioned problem list. Remainder of non-face-to-face time involved reviewing chart documents/testing relevant to the patient encounter and documentation in the medical record. I have independently reviewed documentation from referring provider  Followup:  PRN  Medication Adjustments/Labs and Tests Ordered: Current medicines are reviewed at length with the patient today.  Concerns regarding medicines are outlined above.  Medication changes, Labs and Tests ordered today are listed in the Patient Instructions below.  There are no Patient Instructions on file for this visit.   Signed, Corning Shlomo, MD  02/24/2024 8:48 AM    [facility] [address] [place], [place], [place]  [address] Phone: [phone]; Fax: [phone]

## 2024-02-24 NOTE — Telephone Encounter (Signed)
 Patient agreement reviewed and signed on 02/24/2024.  WatchPAT issued to patient on 02/24/2024 by Woodie LOISE Prudent. Patient aware to not open the WatchPAT box until contacted with the activation PIN. Patient profile initialized in CloudPAT on 02/24/2024 by Rockie RAMAN. Device serial number: [id number]  Please list Reason for Call as Advice Only and type WatchPAT issued to patient in the comment box.

## 2024-02-24 NOTE — Patient Instructions (Addendum)
 Medication Instructions:  Please START taking hydrochlorothiazide  12.5 mg daily. Take this medication at the same time you take your micardis  (also known as telmisartan ).   *If you need a refill on your cardiac medications before your next appointment, please call your pharmacy*  Lab Work: Please return in one week for BMET to be drawn in our first floor lab. You do not need to be fasting for this lab.   If you have labs (blood work) drawn today and your tests are completely normal, you will receive your results only by: MyChart Message (if you have MyChart) OR A paper copy in the mail If you have any lab test that is abnormal or we need to change your treatment, we will call you to review the results.  Testing/Procedures: Your physician has requested that you have an echocardiogram. Echocardiography is a painless test that uses sound waves to create images of your heart. It provides your doctor with information about the size and shape of your heart and how well your heart's chambers and valves are working. This procedure takes approximately one hour. There are no restrictions for this procedure. Please do NOT wear cologne, perfume, aftershave, or lotions (deodorant is allowed). Please arrive 15 minutes prior to your appointment time.  Please note: We ask at that you not bring children with you during ultrasound (echo/ vascular) testing. Due to room size and safety concerns, children are not allowed in the ultrasound rooms during exams. Our front office staff cannot provide observation of children in our lobby area while testing is being conducted. An adult accompanying a patient to their appointment will only be allowed in the ultrasound room at the discretion of the ultrasound technician under special circumstances. We apologize for any inconvenience.    Your cardiac CT will be scheduled at one of the below locations:   [facility] [address] [place], [place]  [address] [phone]  OR   [facility] [address]  [place], [place] [address]  If scheduled at [facility], please arrive at the [facility] and Children's Entrance (Entrance C2) of [facility] 30 minutes prior to test start time. You can use the FREE valet parking offered at entrance C (encouraged to control the heart rate for the test)  Proceed to the [facility] Radiology Department (first floor) to check-in and test prep.   All radiology patients and guests should use entrance C2 at [facility], accessed from [facility], even though the hospital's physical address listed is [address]    If scheduled at the Heart and Vascular Tower at [address], please enter the parking lot using the [address] entrance and use the FREE valet service at the patient drop-off area. Enter the buidling and check-in with registration on the main floor.  Please follow these instructions carefully (unless otherwise directed):  An IV will be required for this test and Nitroglycerin will be given.  Hold all erectile dysfunction medications at least 3 days (72 hrs) prior to test. (Ie viagra, cialis , sildenafil, tadalafil , etc)   On the Night Before the Test: Be sure to Drink plenty of water. Do not consume any caffeinated/decaffeinated beverages or chocolate 12 hours prior to your test. Do not take any antihistamines 12 hours prior to your test.  On the Day of the Test: Drink plenty of water until 1 hour prior to the test. Do not eat any food 1 hour prior to  test. You may take your regular medications prior to the test.  Take metoprolol  (Lopressor ) two hours prior to test. One pill sent to your Pharmacy.  Patients who wear a continuous glucose monitor MUST remove the device prior to scanning. Hold your HCTZ that day.    After the Test: Drink plenty of water. After receiving IV contrast, you may experience a mild  flushed feeling. This is normal. On occasion, you may experience a mild rash up to 24 hours after the test. This is not dangerous. If this occurs, you can take Benadryl 25 mg, Zyrtec, Claritin, or Allegra and increase your fluid intake. (Patients taking Tikosyn should avoid Benadryl, and may take Zyrtec, Claritin, or Allegra) If you experience trouble breathing, this can be serious. If it is severe call 911 IMMEDIATELY. If it is mild, please call our office.  We will call to schedule your test 2-4 weeks out understanding that some insurance companies will need an authorization prior to the service being performed.   For more information and frequently asked questions, please visit our website : [email or web address]  For non-scheduling related questions, please contact the cardiac imaging nurse navigator should you have any questions/concerns: Cardiac Imaging Nurse Navigators Direct Office Dial: [phone]   For scheduling needs, including cancellations and rescheduling, please call [place], [phone].    Follow-Up: At [facility], you and your health needs are our priority.  As part of our continuing mission to provide you with exceptional heart care, our providers are all part of one team.  This team includes your primary Cardiologist (physician) and Advanced Practice Providers or APPs (Physician Assistants and Nurse Practitioners) who all work together to provide you with the care you need, when you need it.  Your next appointment will be dependent on the results of your testing and it will be with:     Provider:   Dr. Wilbert Bihari, MD    Other Instructions Please return for a Blood pressure check with one of our nurses in one week. You are scheduled for Monday, 03/02/24 at 3 pm.

## 2024-02-24 NOTE — Addendum Note (Signed)
 Addended by: JANIT GENI CROME on: 02/24/2024 09:11 AM   Modules accepted: Orders

## 2024-02-26 ENCOUNTER — Other Ambulatory Visit: Payer: Self-pay | Admitting: Cardiology

## 2024-03-02 ENCOUNTER — Ambulatory Visit: Attending: Cardiovascular Disease

## 2024-03-02 DIAGNOSIS — I10 Essential (primary) hypertension: Secondary | ICD-10-CM | POA: Diagnosis not present

## 2024-03-02 DIAGNOSIS — Z79899 Other long term (current) drug therapy: Secondary | ICD-10-CM | POA: Diagnosis not present

## 2024-03-03 ENCOUNTER — Telehealth: Payer: Self-pay

## 2024-03-03 ENCOUNTER — Ambulatory Visit: Payer: Self-pay | Admitting: Cardiology

## 2024-03-03 LAB — BASIC METABOLIC PANEL WITH GFR
BUN/Creatinine Ratio: 14 (ref 10–24)
BUN: 14 mg/dL (ref 8–27)
CO2: 21 mmol/L (ref 20–29)
Calcium: 9.3 mg/dL (ref 8.6–10.2)
Chloride: 104 mmol/L (ref 96–106)
Creatinine, Ser: 1.02 mg/dL (ref 0.76–1.27)
Glucose: 110 mg/dL — ABNORMAL HIGH (ref 70–99)
Potassium: 4.6 mmol/L (ref 3.5–5.2)
Sodium: 138 mmol/L (ref 134–144)
eGFR: 81 mL/min/1.73 (ref >59)

## 2024-03-03 NOTE — Telephone Encounter (Signed)
 Ordering provider: Dr. Shlomo  Associated diagnoses:  Snoring (R06.83) Essential Hypertension (I10) Class two Obesity( Z33.187) WatchPAT PA obtained on 03/03/2024 by Dena JAYSON Hesselbach, CMA. Authorization: No PA required per [employer] provider portal. Patient notified of PIN (1234) on 03/03/2024 via Notification Method: MyChart message.  Phone note routed to covering staff for follow-up.

## 2024-03-04 ENCOUNTER — Other Ambulatory Visit: Payer: Self-pay | Admitting: Urology

## 2024-03-05 ENCOUNTER — Encounter (HOSPITAL_COMMUNITY): Payer: Self-pay

## 2024-03-05 ENCOUNTER — Ambulatory Visit

## 2024-03-05 ENCOUNTER — Telehealth: Payer: Self-pay

## 2024-03-05 NOTE — Telephone Encounter (Signed)
 PAP: Application for Synjardy  has been submitted to [employer]), via fax with income documents.

## 2024-03-06 ENCOUNTER — Telehealth (HOSPITAL_COMMUNITY): Payer: Self-pay | Admitting: *Deleted

## 2024-03-06 NOTE — Telephone Encounter (Signed)
 Attempted to call patient regarding upcoming cardiac CT appointment. Left message on voicemail with name and callback number Johney Frame RN Navigator Cardiac Imaging [facility] [phone] Office

## 2024-03-09 ENCOUNTER — Ambulatory Visit (HOSPITAL_COMMUNITY)
Admission: RE | Admit: 2024-03-09 | Discharge: 2024-03-09 | Disposition: A | Discharge status: Home / Self Care | Source: Ambulatory Visit | Attending: Cardiology | Admitting: Cardiology

## 2024-03-09 ENCOUNTER — Ambulatory Visit

## 2024-03-09 DIAGNOSIS — E785 Hyperlipidemia, unspecified: Secondary | ICD-10-CM | POA: Insufficient documentation

## 2024-03-09 DIAGNOSIS — R9431 Abnormal electrocardiogram [ECG] [EKG]: Secondary | ICD-10-CM | POA: Insufficient documentation

## 2024-03-09 DIAGNOSIS — R931 Abnormal findings on diagnostic imaging of heart and coronary circulation: Secondary | ICD-10-CM | POA: Diagnosis not present

## 2024-03-09 DIAGNOSIS — E1169 Type 2 diabetes mellitus with other specified complication: Secondary | ICD-10-CM | POA: Diagnosis not present

## 2024-03-09 DIAGNOSIS — I2510 Atherosclerotic heart disease of native coronary artery without angina pectoris: Secondary | ICD-10-CM | POA: Diagnosis not present

## 2024-03-09 MED ORDER — IOHEXOL 350 MG/ML SOLN
100.0000 mL | Freq: Once | INTRAVENOUS | Status: AC | PRN
  Administered 2024-03-09: 100 mL via INTRAVENOUS

## 2024-03-09 MED ORDER — NITROGLYCERIN 0.4 MG SL SUBL
0.8000 mg | SUBLINGUAL_TABLET | Freq: Once | SUBLINGUAL | Status: AC
  Administered 2024-03-09: 0.8 mg via SUBLINGUAL

## 2024-03-10 ENCOUNTER — Ambulatory Visit (HOSPITAL_BASED_OUTPATIENT_CLINIC_OR_DEPARTMENT_OTHER)
Admission: RE | Admit: 2024-03-10 | Discharge: 2024-03-10 | Disposition: A | Discharge status: Home / Self Care | Source: Ambulatory Visit | Attending: Cardiology | Admitting: Cardiology

## 2024-03-10 ENCOUNTER — Other Ambulatory Visit: Payer: Self-pay | Admitting: Cardiology

## 2024-03-10 DIAGNOSIS — R9431 Abnormal electrocardiogram [ECG] [EKG]: Secondary | ICD-10-CM | POA: Diagnosis not present

## 2024-03-10 DIAGNOSIS — R931 Abnormal findings on diagnostic imaging of heart and coronary circulation: Secondary | ICD-10-CM | POA: Diagnosis not present

## 2024-03-10 DIAGNOSIS — I2510 Atherosclerotic heart disease of native coronary artery without angina pectoris: Secondary | ICD-10-CM | POA: Diagnosis not present

## 2024-03-10 DIAGNOSIS — E785 Hyperlipidemia, unspecified: Secondary | ICD-10-CM | POA: Diagnosis not present

## 2024-03-10 DIAGNOSIS — E1169 Type 2 diabetes mellitus with other specified complication: Secondary | ICD-10-CM | POA: Diagnosis not present

## 2024-03-10 NOTE — Telephone Encounter (Signed)
 Received letter from [facility] they need proof of income for Spouse listed on application as well before approval.  Sent mychart msg for patient regarding what is needed.

## 2024-03-11 ENCOUNTER — Encounter: Payer: Self-pay | Admitting: Cardiology

## 2024-03-11 ENCOUNTER — Ambulatory Visit: Payer: Self-pay | Admitting: Cardiology

## 2024-03-11 DIAGNOSIS — I2510 Atherosclerotic heart disease of native coronary artery without angina pectoris: Secondary | ICD-10-CM | POA: Insufficient documentation

## 2024-03-12 MED ORDER — ASPIRIN 81 MG PO TBEC: 81.0000 mg | DELAYED_RELEASE_TABLET | Freq: Every day | ORAL | 3 refills | Status: AC

## 2024-03-12 NOTE — Telephone Encounter (Signed)
-----   Message from Levi Roberts sent at 03/11/2024  9:11 AM EDT ----- Coronary CTA showed coronary calcium  score of 71 with 25 to 49% proximal RCA and mid OM1.  His last lipid showed an LDL 67 on 11/05/2023.  Continue atorvastatin  20 mg daily and start aspirin  81 mg  daily follow-up with me in 1 year ----- Message ----- From: Interface, Rad Results In Sent: 03/10/2024  10:02 PM EDT To: Levi JONELLE Bihari, MD

## 2024-03-12 NOTE — Telephone Encounter (Signed)
 Call to patient to advise that Coronary CTA showed coronary calcium  score of 71 with 25 to 49% proximal RCA and mid OM1. Because his last lipid showed an LDL 67 on 11/05/2023, dr. Shlomo recommends that he continue atorvastatin  20 mg daily and start aspirin  81 mg daily, patient agrees to plan and to 1 yr f/u.

## 2024-03-16 ENCOUNTER — Ambulatory Visit (HOSPITAL_COMMUNITY)
Admission: RE | Admit: 2024-03-16 | Discharge: 2024-03-16 | Disposition: A | Discharge status: Home / Self Care | Source: Ambulatory Visit | Attending: Cardiology | Admitting: Cardiology

## 2024-03-16 DIAGNOSIS — I517 Cardiomegaly: Secondary | ICD-10-CM | POA: Diagnosis not present

## 2024-03-16 LAB — ECHOCARDIOGRAM COMPLETE
AR max vel: 4.9 cm2
AV Peak grad: 3.6 mmHg
Ao pk vel: 0.95 m/s
Area-P 1/2: 3.77 cm2
S' Lateral: 2.5 cm

## 2024-03-16 MED ORDER — PERFLUTREN LIPID MICROSPHERE
1.0000 mL | INTRAVENOUS | Status: AC | PRN
  Administered 2024-03-16: 3 mL via INTRAVENOUS

## 2024-03-19 NOTE — Telephone Encounter (Signed)
 PAP: Application for Synjardy  has been submitted to [employer]), via fax With document that [employer] indicated that was needed for review for approval.

## 2024-03-21 ENCOUNTER — Other Ambulatory Visit: Payer: Self-pay | Admitting: Urology

## 2024-03-22 ENCOUNTER — Encounter (INDEPENDENT_AMBULATORY_CARE_PROVIDER_SITE_OTHER): Payer: Self-pay | Admitting: Cardiology

## 2024-03-22 DIAGNOSIS — G4733 Obstructive sleep apnea (adult) (pediatric): Secondary | ICD-10-CM | POA: Diagnosis not present

## 2024-03-23 NOTE — Telephone Encounter (Signed)
 PAP: Patient assistance application for Synjardy  XR has been approved by PAP Companies: [employer] from 03/23/2024 to 08/19/2024. Medication should be delivered to PAP Delivery: Home. For further shipping updates, please contact [employer] ([employer]) at [phone]. Patient ID is: not provided.

## 2024-03-24 ENCOUNTER — Ambulatory Visit: Attending: Cardiology

## 2024-03-24 ENCOUNTER — Telehealth: Payer: Self-pay | Admitting: Pharmacist

## 2024-03-24 DIAGNOSIS — R0683 Snoring: Secondary | ICD-10-CM

## 2024-03-24 DIAGNOSIS — E1165 Type 2 diabetes mellitus with hyperglycemia: Secondary | ICD-10-CM

## 2024-03-24 NOTE — Progress Notes (Signed)
   03/24/2024  Patient ID: Levi Roberts, male   DOB: [birth year], 68 y.o.   MRN: [id number]  Patient was called to follow up on his blood sugars. HIPAA identifiers were obtained.  Patient had been without his medications due to lapses in patient assistance.     He reported his blood sugars have been much better. He sent over the log book from his meter:    Hypertension--Telmisartan  filled 01/30/24 for a 90 day supply- (Medication Adherence)     03/09/2024    2:18 PM 03/09/2024    2:05 PM 02/24/2024    8:34 AM  Vitals with BMI  Height   6' 3  Weight   317 lbs 3 oz  BMI   39.65  Systolic 116 115 857  Diastolic 80 74 76  Pulse 82 75 85    Upcoming appointment with Dr. Jarold 07/07/24  On statin therapy: Atorvastatin  20 mg 1 tablet daily  last filled 01/21/24-SUPD gap closed  Lipid Panel     Component Value Date/Time   CHOL 114 11/05/2023 1126   TRIG 60 11/05/2023 1126   HDL 34 (L) 11/05/2023 1126   CHOLHDL 3.4 11/05/2023 1126   LDLCALC 67 11/05/2023 1126   LABVLDL 13 11/05/2023 1126    Plan:  Will complete at Refill/Reorder/Change for Patient in 1 month for Ozempic  and will review blood sugars at that time as well. Will follow up on new A1c after upcoming appt with PCP in November.   Levi Roberts, PharmD, BCACP Clinical Pharmacist [phone]

## 2024-03-24 NOTE — Procedures (Signed)
     SLEEP STUDY REPORT Patient Information Study Date: 03/22/2024 Patient Name: Levi Roberts Patient ID: [id number] Birth Date: 1955/08/31 Age: 68 Gender: Male BMI: 39.9 (W=317 lb, H=6' 3'') Stopbang: 6 Referring Physician: Wilbert Bihari, MD  TEST DESCRIPTION: Home sleep apnea testing was completed using the WatchPat, a Type 1 device, utilizing  peripheral arterial tonometry (PAT), chest movement, actigraphy, pulse oximetry, pulse rate, body position and snore.  AHI was calculated with apnea and hypopnea using valid sleep time as the denominator. RDI includes apneas,  hypopneas, and RERAs. The data acquired and the scoring of sleep and all associated events were performed in  accordance with the recommended standards and specifications as outlined in the AASM Manual for the Scoring of  Sleep and Associated Events 2.2.0 (2015).  FINDINGS:  1. Severe Obstructive Sleep Apnea with AHI 44.8/hr.   2. No significant Central Sleep Apnea with pAHIc 9.8/hr.  3. Oxygen desaturations as low as 86%.  4. Severe snoring was present. O2 sats were < 88% for 2.6 min.  5. Total sleep time was 6 hrs and 36 min.  6. 8.6% of total sleep time was spent in REM sleep.   7. Shortened sleep onset latency at 6 min.   8. Prolonged REM sleep onset latency at 214 min.   9. Total awakenings were 13.  10. Arrhythmia detection: None  DIAGNOSIS:  Severe Obstructive Sleep Apnea (G47.33)  RECOMMENDATIONS: 1. Clinical correlation of these findings is necessary. The decision to treat obstructive sleep apnea (OSA) is usually  based on the presence of apnea symptoms or the presence of associated medical conditions such as Hypertension,  Congestive Heart Failure, Atrial Fibrillation or Obesity. The most common symptoms of OSA are snoring, gasping for  breath while sleeping, daytime sleepiness and fatigue.  2. Initiating apnea therapy is recommended given the presence of symptoms and/or associated conditions.   Recommend proceeding with one of the following:  a. Auto-CPAP therapy with a pressure range of 5-20cm H2O.  b. An oral appliance (OA) that can be obtained from certain dentists with expertise in sleep medicine. These are  primarily of use in non-obese patients with mild and moderate disease.  c. An ENT consultation which may be useful to look for specific causes of obstruction and possible treatment  options.  d. If patient is intolerant to PAP therapy, consider referral to ENT for evaluation for hypoglossal nerve stimulator.  3. Close follow-up is necessary to ensure success with CPAP or oral appliance therapy for maximum benefit . 4. A follow-up oximetry study on CPAP is recommended to assess the adequacy of therapy and determine the need  for supplemental oxygen or the potential need for Bi-level therapy. An arterial blood gas to determine the adequacy of  baseline ventilation and oxygenation should also be considered. 5. Healthy sleep recommendations include: adequate nightly sleep (normal 7-9 hrs/night), avoidance of caffeine after  noon and alcohol near bedtime, and maintaining a sleep environment that is cool, dark and quiet. 6. Weight loss for overweight patients is recommended. Even modest amounts of weight loss can significantly  improve the severity of sleep apnea. 7. Snoring recommendations include: weight loss where appropriate, side sleeping, and avoidance of alcohol before  bed. 8. Operation of motor vehicle should be avoided when sleepy.  Signature: Wilbert Bihari, MD; [facility]; Diplomat, American Board of Sleep  Medicine Electronically Signed: 03/24/2024 11:27:47 AM

## 2024-04-14 ENCOUNTER — Telehealth: Payer: Self-pay | Admitting: Pharmacy Technician

## 2024-04-14 NOTE — Progress Notes (Signed)
   04/14/2024  Patient ID: Levi Roberts, male   DOB: 12-14-1955, 68 y.o.   MRN: [id number]  Patient engaged with clinical pharmacist for management of diabetes on 03/24/2024. Outreach by [employer] technician was requested.   Outreached patient to discuss diabetes medication management. Left voicemail for patient to return my call at their convenience.   Adira Limburg, CPhT [facility] Office: [phone] Email: [email or web address]

## 2024-04-15 ENCOUNTER — Telehealth: Payer: Self-pay | Admitting: Pharmacy Technician

## 2024-04-15 ENCOUNTER — Encounter (HOSPITAL_COMMUNITY): Payer: Self-pay | Admitting: Pharmacy Technician

## 2024-04-15 NOTE — Telephone Encounter (Signed)
 Erroneous Encounter.  Levi Roberts, CPhT [facility] Office: [phone] Email: [email or web address]

## 2024-04-15 NOTE — Progress Notes (Signed)
   04/15/2024 Name: Levi Roberts MRN: [id number] DOB: 02-14-1956  Patient is appearing on a report for [employer] [place] [employer] Diabetes and last engaged with the clinical pharmacist to discuss diabetes on 03/24/2024. Contacted patient today to discuss diabetes management and completed medication review.   Diabetes Plan from last clinical pharmacist appointment:  Plan: Will complete at Refill/Reorder/Change for Patient in 1 month for Ozempic  and will review blood sugars at that time as well. Will follow up on new A1c after upcoming appt with PCP in November.(copy/paste from last note)   Medication Adherence Barriers Identified:  Patient made recommended medication changes per plan: Yes Patient informs he takes Synjardy , 2 tablets once a day, Ozempic  2mg  weekly and Tresiba  55 units daily. He informs he has received all 3 medications from the respective patient assistance programs. He informs he has 7 doses of Ozempic  left and is running low on Tresiba . He was informed to call [facility] for Synjardy  refill when he has about a 2 week supply remaining. Patient verbalized understanding. Access issues with any new medication or testing device: No Patient informs he is running low on Tresiba  and has around 7 doses left of Ozempic .Care coordination call placed to Novo Nordisk. Spoke to [place] who informs Tresiba  will begin processing around 05/04/24 with delivery in 10-14 business days to the provider's office from that date. Paris informs patient last received 5 boxes (75ml) delivered on 02/14/24 which is a little over 120 days supply so theoretically patient should have enough to last until next refill arrives. Paris informs a refill/change request form will need to be completed for Ozempic  and Novofine needles. She informs to write REFILL at the top of the page and she informs that can be faxed in this week. Will send message to PharmD. Patient portion of patient assistance completed: Yes  Patient is checking  blood sugars as prescribed: Yes Patient informs he is checking blood sugar. He informs he sent it over to the PharmD for her review. He informs his average blood sugar is 107.   Medication Adherence Barriers Addressed/Actions Taken:  Reviewed medication changes per plan from last clinical pharmacist note Medication Access for Ozempic  a Will discuss medication access concerns with pharmacist Contacted pharmacy regarding new prescriptions Educated patient to contact pharmacy regarding new prescriptions/refills Reorder form with [employer] needs to be completed and faxed in for patient's Ozempic  Patient Assistance in Progress for Ozempic , Tresiba  and Synjardy  Patient Assistance Program approved Reviewed instructions for monitoring blood sugars at home and reminded patient to keep a written log to review with pharmacist Reminded patient of date/time of upcoming clinical pharmacist follow up and any upcoming PCP/specialists visits. Patient denies transportation barriers to the appointment. Yes  Next clinical pharmacist appointment is scheduled for: TBD, PCP appointment scheudled for 07/07/2024  Kate Caddy, CPhT [facility] Office: [phone] Email: [email or web address]

## 2024-04-21 ENCOUNTER — Telehealth: Payer: Self-pay | Admitting: Pharmacist

## 2024-04-21 DIAGNOSIS — E1169 Type 2 diabetes mellitus with other specified complication: Secondary | ICD-10-CM

## 2024-04-21 NOTE — Progress Notes (Signed)
 Error

## 2024-04-21 NOTE — Progress Notes (Signed)
 04/21/2024  Patient ID: Levi Roberts, male   DOB: 1955/12/23, 68 y.o.   MRN: [id number]  Patient was called to follow up on his blood sugars and to touch base about medication assistance. HIPAA identifiers were obtained.  Levi Roberts is a 68 year old male with multiple medical conditions including but not limited to:  hypertension, coronary artery disease, type 2 diabetes and hyperlipidemia.  He provided the following blood sugars from his meter:  Date Range: Mar 16, 2024 to Apr 14, 2024 Average:  107 mg/dl   TUESDAY, April 14, 2024 12:47 PM 107 mg/dL   2:72 AM 895 mg/dL T  MONDAY, April 13, 2024 11:37 AM 100 mg/dL   3:44 AM 90 mg/dL   SUNDAY, April 12, 2024 6:56 PM 101 mg/dL   87:84 PM 882 mg/dL   SATURDAY, April 11, 2024 9:37 AM 116 mg/dL   FRIDAY, April 10, 2024 7:26 AM 99 mg/dL   THURSDAY, April 09, 2024 9:43 PM 143 mg/dL   0:83 AM 895 mg/dL   WEDNESDAY, April 08, 2024 5:13 PM 157 mg/dL   2:92 AM 895 mg/dL   TUESDAY, April 07, 2024 4:23 PM 96 mg/dL   3:60 AM 96 mg/dL   MONDAY, April 06, 2024 6:44 PM 110 mg/dL  2:69 AM 896 mg/dL   SUNDAY, April 05, 2024 9:20 AM 90 mg/dL   SATURDAY, April 04, 2024 9:14 AM 99 mg/dL   FRIDAY, April 03, 2024 6:37 AM 95 mg/dL   THURSDAY, April 02, 2024 5:12 PM 118 mg/dL   2:74 AM 98 mg/dL  WEDNESDAY, April 01, 2024 7:40 AM 113 mg/dL   TUESDAY, March 31, 2024 6:57 PM 102 mg/dL   0:65 AM 885 mg/dL   MONDAY, March 30, 2024 11:01 AM 107 mg/dL   SUNDAY, March 29, 2024 11:34 AM 121 mg/dL   SATURDAY, March 28, 2024 9:10 AM 87 mg/dL   FRIDAY, March 27, 2024 7:10 AM 94 mg/dL   THURSDAY, March 26, 2024 7:24 AM 115 mg/dL   WEDNESDAY, March 25, 2024 10:08 PM 121 mg/dL   2:78 AM 96 mg/dL   TUESDAY, March 24, 2024 10:36 PM 109 mg/dL   1:68 AM 98 mg/dL   MONDAY, March 23, 2024 8:19 PM 105 mg/dL   2:80 AM 881 mg/dL   SUNDAY, March 22, 2024 11:21 AM 101 mg/dL   SATURDAY, March 21, 2024 9:49 AM  106 mg/dL   FRIDAY, March 20, 2024 7:13 AM 102 mg/dL   THURSDAY, March 19, 2024 7:16 PM 112 mg/dL   2:83 AM 893 mg/dL   WEDNESDAY, March 18, 2024 5:06 PM 109 mg/dL   2:65 AM 898 mg/dL   TUESDAY, March 17, 2024 12:23 PM 106 mg/dL   MONDAY, March 16, 2024 9:37 AM 124 mg/dL      Medications Reviewed Today     Reviewed by Jolee Cassius PARAS, [facility] (Pharmacist) on 04/21/24 at 1731  Med List Status: <None>   Medication Order Taking? Sig Documenting Provider Last Dose Status Informant  aspirin  EC 81 MG tablet [id number] Yes Take 1 tablet (81 mg total) by mouth daily. Swallow whole. Shlomo Wilbert SAUNDERS, MD  Active   atorvastatin  (LIPITOR) 20 MG tablet [id number] Yes Take 1 tablet (20 mg total) by mouth daily. Jarold Medici, MD  Active   Empagliflozin -metFORMIN  HCl ER (SYNJARDY  XR) 12.12-998 MG TB24 [id number] Yes Take 2 tablets by mouth daily. Jarold Medici, MD  Active   finasteride  (PROSCAR ) 5 MG tablet [id number] Yes TAKE 1 TABLET  EVERY DAY Stoioff, Glendia BROCKS, MD  Active   hydrochlorothiazide  (MICROZIDE ) 12.5 MG capsule [id number] Yes Take 1 capsule (12.5 mg total) by mouth daily. Shlomo Wilbert SAUNDERS, MD  Active   insulin  degludec (TRESIBA  FLEXTOUCH) 200 UNIT/ML FlexTouch Pen [id number] Yes Inject 54 Units into the skin daily. Jarold Medici, MD  Active   Insulin  Pen Needle (NOVOFINE PEN NEEDLE) 32G X 6 MM MISC [id number] Yes Use for daily insulin  injections Jarold Medici, MD  Active   metoprolol  tartrate (LOPRESSOR ) 100 MG tablet [id number]  Take 1 tablet (100 mg total) by mouth once for 1 dose. Take 90-120 minutes prior to scan. Shlomo Wilbert SAUNDERS, MD  Expired 03/24/24 2359   OZEMPIC , 2 MG/DOSE, 8 MG/3ML SOPN [id number] Yes Inject 2 mg into the skin once a week. [provider]  Active   tamsulosin  (FLOMAX ) 0.4 MG CAPS capsule [id number] Yes Take 2 capsules (0.8 mg total) by mouth daily. Twylla Glendia BROCKS, MD  Active   telmisartan  (MICARDIS ) 20 MG tablet [id number] Yes Take 1 tablet (20 mg total) by mouth daily.  Shlomo Wilbert SAUNDERS, MD  Active             Patient reported that he received Synjardy  through [employer].  There had been an issue with his financial documentation.    He also reported having two boxes of Ozempic  left. A request will be sent to Luke Cecille Sola to complete a reorder/refill request form.  SUPD gap closed-Atorvastatin  20 mg-90 filled 04/07/24 ACE/ARB-Telmisartan  20 mg 90-filled 04/07/24   Plan: Follow up in October for renewal of Patient Assistance Programs.  Cassius DOROTHA Brought, PharmD, BCACP Clinical Pharmacist [phone]

## 2024-04-22 ENCOUNTER — Telehealth: Payer: Self-pay

## 2024-04-22 NOTE — Telephone Encounter (Signed)
 Completed refill order form for Ozempic  (Novo Nordisk) and faxed to Provider's office for review and signature.

## 2024-04-29 ENCOUNTER — Telehealth: Payer: Self-pay | Admitting: *Deleted

## 2024-04-29 NOTE — Telephone Encounter (Signed)
 The patient has been notified of the result and verbalized understanding.  All questions (if any) were answered. Joshua Dalton Seip, CMA 04/29/2024 5:36 PM    Patient has declined and will think about it and call us  back at a later date.

## 2024-04-29 NOTE — Telephone Encounter (Signed)
-----   Message from Levi Roberts sent at 03/24/2024 11:29 AM EDT ----- Please let patient know that they have sleep apnea.  Recommend therapeutic CPAP titration for treatment of patient's sleep disordered breathing.

## 2024-05-07 ENCOUNTER — Ambulatory Visit: Payer: Medicare HMO | Admitting: Urology

## 2024-05-08 ENCOUNTER — Ambulatory Visit: Payer: Self-pay | Admitting: Urology

## 2024-05-08 ENCOUNTER — Encounter: Payer: Self-pay | Admitting: Urology

## 2024-05-08 VITALS — BP 121/76 | HR 78 | Ht 75.0 in | Wt 312.0 lb

## 2024-05-08 DIAGNOSIS — N401 Enlarged prostate with lower urinary tract symptoms: Secondary | ICD-10-CM | POA: Diagnosis not present

## 2024-05-08 DIAGNOSIS — R972 Elevated prostate specific antigen [PSA]: Secondary | ICD-10-CM

## 2024-05-08 DIAGNOSIS — N5201 Erectile dysfunction due to arterial insufficiency: Secondary | ICD-10-CM

## 2024-05-08 LAB — BLADDER SCAN AMB NON-IMAGING

## 2024-05-08 MED ORDER — FINASTERIDE 5 MG PO TABS: 5.0000 mg | ORAL_TABLET | Freq: Every day | ORAL | 3 refills | Status: AC

## 2024-05-08 MED ORDER — TAMSULOSIN HCL 0.4 MG PO CAPS: 0.8000 mg | ORAL_CAPSULE | Freq: Every day | ORAL | 3 refills | Status: AC

## 2024-05-08 MED ORDER — TADALAFIL 20 MG PO TABS: ORAL_TABLET | ORAL | 0 refills | Status: DC

## 2024-05-08 NOTE — Progress Notes (Signed)
 05/08/2024 8:45 AM   Levi Roberts 1956-04-25 [id number]  Referring provider: Jarold Medici, MD [address] [place],  [place] [address]  Chief Complaint  Patient presents with   Benign Prostatic Hypertrophy   Urologic history:  BPH with LUTS Previously on finasteride  which was restarted May 2021 Tamsulosin  added 01/2022 for worsening LUTS/PVR 480   2.  Elevated PSA 2 prior negative biopsies; Dr. Ottelin at [facility]   3.  Urethral bleeding Cystoscopy 12/2019 with BPH   4.  Erectile dysfunction PDE 5 inhibitor refractory Urethral bleeding with vacuum erection device Did not tolerate intracavernosal injections   HPI: Levi Roberts is a 68 y.o. male presents for annual follow-up.  His spouse was with him today.  Only complaint has been erectile dysfunction.  Not much today and penile prosthesis No bothersome lower urinary tract symptoms; remains on tamsulosin /finasteride  Denies recurrent gross hematuria or urethral bleeding   PMH: Past Medical History:  Diagnosis Date   CAD (coronary artery disease), native coronary artery    coronary calcium  score of 71 with 25 to 49% proximal RCA and mid OM1 on CTA 02/2024   Diabetes mellitus without complication (HCC)    High cholesterol    Hypertension     Surgical History: No past surgical history on file.  Home Medications:  Allergies as of 05/08/2024   No Known Allergies      Medication List        Accurate as of May 08, 2024  8:45 AM. If you have any questions, ask your nurse or doctor.          aspirin  EC 81 MG tablet Take 1 tablet (81 mg total) by mouth daily. Swallow whole.   atorvastatin  20 MG tablet Commonly known as: LIPITOR Take 1 tablet (20 mg total) by mouth daily.   finasteride  5 MG tablet Commonly known as: PROSCAR  TAKE 1 TABLET EVERY DAY   hydrochlorothiazide  12.5 MG capsule Commonly known as: MICROZIDE  Take 1 capsule (12.5 mg total) by mouth daily.    metoprolol  tartrate 100 MG tablet Commonly known as: Lopressor  Take 1 tablet (100 mg total) by mouth once for 1 dose. Take 90-120 minutes prior to scan.   Novofine Pen Needle 32G X 6 MM Misc Generic drug: Insulin  Pen Needle Use for daily insulin  injections   Ozempic  (2 MG/DOSE) 8 MG/3ML Sopn Generic drug: Semaglutide  (2 MG/DOSE) Inject 2 mg into the skin once a week.   Synjardy  XR 12.12-998 MG Tb24 Generic drug: Empagliflozin -metFORMIN  HCl ER Take 2 tablets by mouth daily.   tamsulosin  0.4 MG Caps capsule Commonly known as: FLOMAX  Take 2 capsules (0.8 mg total) by mouth daily.   telmisartan  20 MG tablet Commonly known as: Micardis  Take 1 tablet (20 mg total) by mouth daily.   Tresiba  FlexTouch 200 UNIT/ML FlexTouch Pen Generic drug: insulin  degludec Inject 54 Units into the skin daily.        Allergies: No Known Allergies  Family History: Family History  Problem Relation Age of Onset   Diabetes Mother    Heart attack Mother 43   Kidney cancer Father    Cancer Father    Diabetes Sister    Healthy Sister     Social History:  reports that he has never smoked. He has never used smokeless tobacco. He reports that he does not drink alcohol and does not use drugs.   Physical Exam: BP 121/76   Pulse 78   Ht 6' 3 (1.905 m)  Wt (!) 312 lb (141.5 kg)   SpO2 95%   BMI 39.00 kg/m   Constitutional:  Alert, No acute distress. HEENT: [place] AT Respiratory: Normal respiratory effort, no increased work of breathing. Psychiatric: Normal mood and affect.   Assessment & Plan:    1. Benign prostatic hyperplasia with lower urinary tract symptoms Stable on tamsulosin /finasteride  which were refilled PVR today 0 mL  2.  Erectile dysfunction PDE 5 inhibitor refractory Failed second line options Does not desire evaluation for penile prosthesis He was interested in a trial of tadalafil  and titrating to 40 mg-Rx sent to pharmacy  3.  Elevated PSA PSA drawn  today  Continue annual follow-up   Glendia JAYSON Barba, MD  [facility] [address] [place], [place] [address] [phone]

## 2024-05-09 LAB — PSA: Prostate Specific Ag, Serum: 4.4 ng/mL — ABNORMAL HIGH (ref 0.0–4.0)

## 2024-05-10 ENCOUNTER — Ambulatory Visit: Payer: Self-pay | Admitting: Urology

## 2024-05-11 ENCOUNTER — Encounter: Payer: Self-pay | Admitting: Urology

## 2024-05-28 ENCOUNTER — Encounter: Payer: Self-pay | Admitting: Pharmacist

## 2024-06-02 DIAGNOSIS — E119 Type 2 diabetes mellitus without complications: Secondary | ICD-10-CM | POA: Diagnosis not present

## 2024-06-19 ENCOUNTER — Other Ambulatory Visit: Payer: Self-pay | Admitting: Internal Medicine

## 2024-06-19 ENCOUNTER — Telehealth: Payer: Self-pay

## 2024-06-19 DIAGNOSIS — E1169 Type 2 diabetes mellitus with other specified complication: Secondary | ICD-10-CM

## 2024-06-19 NOTE — Telephone Encounter (Signed)
 PAP: Patient assistance application for Synjardy  through [employer]) has been mailed to pt's home address on file. Provider portion of application will be faxed to provider's office.

## 2024-07-01 ENCOUNTER — Encounter: Payer: Self-pay | Admitting: Pharmacist

## 2024-07-01 MED ORDER — SYNJARDY XR 12.5-1000 MG PO TB24: 2.0000 | ORAL_TABLET | Freq: Every day | ORAL | 3 refills | Status: AC

## 2024-07-01 NOTE — Progress Notes (Unsigned)
   07/01/2024  Patient ID: Levi Roberts, male   DOB: [birth year], 68 y.o.   MRN: [id number]  Synjardy  paperwork

## 2024-07-02 ENCOUNTER — Other Ambulatory Visit (HOSPITAL_COMMUNITY): Payer: Self-pay

## 2024-07-03 NOTE — Telephone Encounter (Signed)
 Received Provider portion PAP application for Synjardy  ([facility])

## 2024-07-06 ENCOUNTER — Telehealth: Payer: Self-pay

## 2024-07-06 NOTE — Telephone Encounter (Signed)
 PAP: Patient assistance application for Tresiba  through [employer] has been mailed to pt's home address on file. Provider portion of application will be faxed to provider's office. Patient portion e-filed.

## 2024-07-07 ENCOUNTER — Ambulatory Visit: Payer: Self-pay | Admitting: Internal Medicine

## 2024-07-07 ENCOUNTER — Encounter: Payer: Self-pay | Admitting: Internal Medicine

## 2024-07-07 VITALS — BP 118/60 | HR 97 | Temp 98.3°F | Ht 75.0 in | Wt 307.0 lb

## 2024-07-07 DIAGNOSIS — Z2821 Immunization not carried out because of patient refusal: Secondary | ICD-10-CM

## 2024-07-07 DIAGNOSIS — E66812 Obesity, class 2: Secondary | ICD-10-CM | POA: Diagnosis not present

## 2024-07-07 DIAGNOSIS — Z6838 Body mass index (BMI) 38.0-38.9, adult: Secondary | ICD-10-CM | POA: Diagnosis not present

## 2024-07-07 DIAGNOSIS — I10 Essential (primary) hypertension: Secondary | ICD-10-CM

## 2024-07-07 DIAGNOSIS — E785 Hyperlipidemia, unspecified: Secondary | ICD-10-CM

## 2024-07-07 DIAGNOSIS — E6609 Other obesity due to excess calories: Secondary | ICD-10-CM | POA: Diagnosis not present

## 2024-07-07 DIAGNOSIS — E1169 Type 2 diabetes mellitus with other specified complication: Secondary | ICD-10-CM | POA: Diagnosis not present

## 2024-07-07 LAB — HEMOGLOBIN A1C
Est. average glucose Bld gHb Est-mCnc: 160 mg/dL
Hgb A1c MFr Bld: 7.2 % — ABNORMAL HIGH (ref 4.8–5.6)

## 2024-07-07 LAB — CMP14+EGFR
ALT: 12 IU/L (ref 0–44)
AST: 13 IU/L (ref 0–40)
Albumin: 4 g/dL (ref 3.9–4.9)
Alkaline Phosphatase: 81 IU/L (ref 47–123)
BUN/Creatinine Ratio: 11 (ref 10–24)
BUN: 15 mg/dL (ref 8–27)
Bilirubin Total: 0.4 mg/dL (ref 0.0–1.2)
CO2: 23 mmol/L (ref 20–29)
Calcium: 9.5 mg/dL (ref 8.6–10.2)
Chloride: 100 mmol/L (ref 96–106)
Creatinine, Ser: 1.35 mg/dL — ABNORMAL HIGH (ref 0.76–1.27)
Globulin, Total: 3.6 g/dL (ref 1.5–4.5)
Glucose: 121 mg/dL — ABNORMAL HIGH (ref 70–99)
Potassium: 5 mmol/L (ref 3.5–5.2)
Sodium: 138 mmol/L (ref 134–144)
Total Protein: 7.6 g/dL (ref 6.0–8.5)
eGFR: 58 mL/min/1.73 — ABNORMAL LOW (ref >59)

## 2024-07-07 NOTE — Progress Notes (Signed)
 I,Levi Roberts, CMA,acting as a neurosurgeon for Levi LOISE Slocumb, MD.,have documented all relevant documentation on the behalf of Levi LOISE Slocumb, MD,as directed by  Levi LOISE Slocumb, MD while in the presence of Levi LOISE Slocumb, MD.  Subjective:  Patient ID: Levi Roberts , male    DOB: 1956-05-12 , 68 y.o.   MRN: [id number]  Chief Complaint  Patient presents with   Diabetes    Patient presents today for a diabetes check. Patient reports compliance with her meds. Patient doesn't have any questions or concerns at this time.   Hypertension    HPI Discussed the use of AI scribe software for clinical note transcription with the patient, who gave verbal consent to proceed.  History of Present Illness Levi Roberts is a 68 year old male with diabetes and hypertension who presents for a diabetes and blood pressure check.  His blood sugar levels have been improving, with the lowest readings in the eighties. He has been taking Synjardy  twice a day and reduced his Tresiba  dose from 54 units to 50 units about two months ago when his sugar levels started improving. He has lost another five pounds in the last couple of months, totaling a ten-pound loss since July, which he attributes to not eating late at night and correlates with better blood sugar control.  His current medication regimen includes aspirin , atorvastatin , telmisartan  20 mg, and hydrochlorothiazide  12.5 mg. He continues to see a urologist for ongoing care.  He does not receive flu shots due to a family history of adverse reactions but does receive pneumonia and tetanus vaccinations as needed. He recently had an eye exam at [facility] and believes the results were sent to the clinic. He is not planning to receive the shingles vaccine at this time.   Diabetes He presents for his follow-up diabetic visit. He has type 2 diabetes mellitus. His disease course has been stable. Pertinent negatives for hypoglycemia include no  dizziness or headaches. Pertinent negatives for diabetes include no blurred vision, no polydipsia, no polyphagia and no polyuria. There are no hypoglycemic complications. Risk factors for coronary artery disease include diabetes mellitus, dyslipidemia, hypertension, male sex, obesity and sedentary lifestyle. He is compliant with treatment some of the time. He is following a diabetic diet. He participates in exercise intermittently. His breakfast blood glucose is taken between 8-9 am. His breakfast blood glucose range is generally 130-140 mg/dl. An ACE inhibitor/angiotensin II receptor blocker is being taken.  Hypertension This is a chronic problem. The current episode started more than 1 year ago. The problem has been gradually improving since onset. The problem is controlled. Pertinent negatives include no blurred vision or headaches. Risk factors for coronary artery disease include diabetes mellitus, dyslipidemia, obesity and sedentary lifestyle. Past treatments include angiotensin blockers. The current treatment provides moderate improvement. Compliance problems include exercise.      Past Medical History:  Diagnosis Date   CAD (coronary artery disease), native coronary artery    coronary calcium  score of 71 with 25 to 49% proximal RCA and mid OM1 on CTA 02/2024   Diabetes mellitus without complication (HCC)    High cholesterol    Hypertension      Family History  Problem Relation Age of Onset   Diabetes Mother    Heart attack Mother 33   Kidney cancer Father    Cancer Father    Diabetes Sister    Healthy Sister      Current Outpatient Medications:  aspirin  EC 81 MG tablet, Take 1 tablet (81 mg total) by mouth daily. Swallow whole., Disp: 90 tablet, Rfl: 3   atorvastatin  (LIPITOR) 20 MG tablet, TAKE 1 TABLET EVERY DAY, Disp: 90 tablet, Rfl: 3   Empagliflozin -metFORMIN  HCl ER (SYNJARDY  XR) 12.12-998 MG TB24, Take 2 tablets by mouth daily., Disp: 180 tablet, Rfl: 3   finasteride   (PROSCAR ) 5 MG tablet, Take 1 tablet (5 mg total) by mouth daily., Disp: 90 tablet, Rfl: 3   hydrochlorothiazide  (MICROZIDE ) 12.5 MG capsule, Take 1 capsule (12.5 mg total) by mouth daily., Disp: 90 capsule, Rfl: 3   insulin  degludec (TRESIBA  FLEXTOUCH) 200 UNIT/ML FlexTouch Pen, Inject 54 Units into the skin daily., Disp: 9 mL, Rfl: 1   Insulin  Pen Needle (NOVOFINE PEN NEEDLE) 32G X 6 MM MISC, Use for daily insulin  injections, Disp: 100 each, Rfl: 1   OZEMPIC , 2 MG/DOSE, 8 MG/3ML SOPN, Inject 2 mg into the skin once a week., Disp: , Rfl:    tadalafil  (CIALIS ) 20 MG tablet, 1-2 by mouth 1 hour prior to intercourse, Disp: 30 tablet, Rfl: 0   tamsulosin  (FLOMAX ) 0.4 MG CAPS capsule, Take 2 capsules (0.8 mg total) by mouth daily., Disp: 90 capsule, Rfl: 3   telmisartan  (MICARDIS ) 20 MG tablet, Take 1 tablet (20 mg total) by mouth daily., Disp: 90 tablet, Rfl: 3   No Known Allergies   Review of Systems  Constitutional: Negative.   Eyes: Negative.  Negative for blurred vision.  Respiratory: Negative.    Cardiovascular: Negative.   Gastrointestinal: Negative.   Endocrine: Negative for polydipsia, polyphagia and polyuria.  Musculoskeletal: Negative.   Skin: Negative.   Neurological:  Negative for dizziness and headaches.  Psychiatric/Behavioral: Negative.       Today's Vitals   07/07/24 0934  BP: 118/60  Pulse: 97  Temp: 98.3 F (36.8 C)  TempSrc: Oral  Weight: (!) 307 lb (139.3 kg)  Height: 6' 3 (1.905 m)  PainSc: 0-No pain   Body mass index is 38.37 kg/m.  Wt Readings from Last 3 Encounters:  07/07/24 (!) 307 lb (139.3 kg)  05/08/24 (!) 312 lb (141.5 kg)  02/24/24 (!) 317 lb 3.2 oz (143.9 kg)    The ASCVD Risk score (Arnett DK, et al., 2019) failed to calculate for the following reasons:   The valid total cholesterol range is 130 to 320 mg/dL  Objective:  Physical Exam Vitals and nursing note reviewed.  Constitutional:      Appearance: Normal appearance. He is obese.   HENT:     Head: Normocephalic and atraumatic.  Eyes:     Extraocular Movements: Extraocular movements intact.  Cardiovascular:     Rate and Rhythm: Normal rate and regular rhythm.     Heart sounds: Normal heart sounds.  Pulmonary:     Effort: Pulmonary effort is normal.     Breath sounds: Normal breath sounds.  Musculoskeletal:     Cervical back: Normal range of motion.  Skin:    General: Skin is warm.  Neurological:     General: No focal deficit present.     Mental Status: He is alert.  Psychiatric:        Mood and Affect: Mood normal.       Assessment And Plan:   Assessment & Plan Dyslipidemia associated with type 2 diabetes mellitus (HCC) Managed with Synjardy , Ozempic  and Tresiba . Glucose levels well-controlled with occasional hypoglycemia. Reduced Tresiba  to 50 units due to improved glucose levels. Awaiting A1c results, target <8%. - Continue  Synjardy , Ozempic  and Tresiba  at current doses. - He reduced Tresiba  to 50 units nightly due to improved blood sugars. - Await A1c results. - Also congratulated on lifestyle changes. Essential hypertension Chronic, well controlled.  He will continue with telmisartan  20mg  daily. - Encouraged to follow low sodium diet.  - He will f/u in four months for re-evaluation.  Influenza vaccination declined  Herpes zoster vaccination declined  Class 2 obesity due to excess calories with body mass index (BMI) of 38.0 to 38.9 in adult, unspecified whether serious comorbidity present He was congratulated on recent 5lb weight loss. Encouraged to strive for BMI less than 32 to decrease cardiac risk.     Orders Placed This Encounter  Procedures   CMP14+EGFR   Hemoglobin A1c   Return if symptoms worsen or fail to improve.  Patient was given opportunity to ask questions. Patient verbalized understanding of the plan and was able to repeat key elements of the plan. All questions were answered to their satisfaction.   I, Levi LOISE Slocumb, MD,  have reviewed all documentation for this visit. The documentation on 07/07/24 for the exam, diagnosis, procedures, and orders are all accurate and complete.   IF YOU HAVE BEEN REFERRED TO A SPECIALIST, IT MAY TAKE 1-2 WEEKS TO SCHEDULE/PROCESS THE REFERRAL. IF YOU HAVE NOT HEARD FROM US /SPECIALIST IN TWO WEEKS, PLEASE GIVE US  A CALL AT [phone].

## 2024-07-07 NOTE — Assessment & Plan Note (Signed)
 Managed with Synjardy , Ozempic  and Tresiba . Glucose levels well-controlled with occasional hypoglycemia. Reduced Tresiba  to 50 units due to improved glucose levels. Awaiting A1c results, target <8%. - Continue Synjardy , Ozempic  and Tresiba  at current doses. - He reduced Tresiba  to 50 units nightly due to improved blood sugars. - Await A1c results. - Also congratulated on lifestyle changes.

## 2024-07-07 NOTE — Patient Instructions (Signed)

## 2024-07-07 NOTE — Assessment & Plan Note (Addendum)
 Chronic, well controlled.  He will continue with telmisartan  20mg  daily. - Encouraged to follow low sodium diet.  - He will f/u in four months for re-evaluation.

## 2024-07-08 ENCOUNTER — Encounter: Payer: Self-pay | Admitting: Pharmacist

## 2024-07-08 ENCOUNTER — Ambulatory Visit: Payer: Self-pay | Admitting: Internal Medicine

## 2024-07-08 NOTE — Progress Notes (Signed)
   07/08/2024  Patient ID: Abran LITTIE Breed, male   DOB: December 01, 1955, 68 y.o.   MRN: [id number]  Patient assistance forms for Novofine needles and Tresiba  were completed and Provider signature was obtained.  Forms were faxed to Luke Mall, CPhT for processing and scanning.  Lab Results  Component Value Date   HGBA1C 7.2 (H) 07/07/2024   HGBA1C 8.8 (H) 02/10/2024   HGBA1C 8.7 (H) 11/05/2023   Cassius DOROTHA Brought, PharmD, BCACP Clinical Pharmacist [phone]

## 2024-07-09 NOTE — Telephone Encounter (Signed)
 PAP: Application for [name] has been submitted to [employer], via fax

## 2024-07-13 NOTE — Telephone Encounter (Signed)
 PAP: Patient assistance application for Tresiba  has been approved by PAP Companies: [employer] from 08/21/2023 to 08/19/2025. Medication should be delivered to PAP Delivery: Home. For further shipping updates, please contact [employer] at [phone]. Patient ID is: [id number]

## 2024-07-14 DIAGNOSIS — E119 Type 2 diabetes mellitus without complications: Secondary | ICD-10-CM | POA: Diagnosis not present

## 2024-07-14 DIAGNOSIS — I2510 Atherosclerotic heart disease of native coronary artery without angina pectoris: Secondary | ICD-10-CM | POA: Diagnosis not present

## 2024-07-14 DIAGNOSIS — Z833 Family history of diabetes mellitus: Secondary | ICD-10-CM | POA: Diagnosis not present

## 2024-07-14 DIAGNOSIS — Z809 Family history of malignant neoplasm, unspecified: Secondary | ICD-10-CM | POA: Diagnosis not present

## 2024-07-14 DIAGNOSIS — I10 Essential (primary) hypertension: Secondary | ICD-10-CM | POA: Diagnosis not present

## 2024-07-14 DIAGNOSIS — Z794 Long term (current) use of insulin: Secondary | ICD-10-CM | POA: Diagnosis not present

## 2024-07-14 DIAGNOSIS — E785 Hyperlipidemia, unspecified: Secondary | ICD-10-CM | POA: Diagnosis not present

## 2024-07-14 DIAGNOSIS — Z8249 Family history of ischemic heart disease and other diseases of the circulatory system: Secondary | ICD-10-CM | POA: Diagnosis not present

## 2024-07-14 DIAGNOSIS — N400 Enlarged prostate without lower urinary tract symptoms: Secondary | ICD-10-CM | POA: Diagnosis not present

## 2024-07-24 ENCOUNTER — Ambulatory Visit
Admission: EM | Admit: 2024-07-24 | Discharge: 2024-07-24 | Disposition: A | Discharge status: Home / Self Care | Attending: Physician Assistant | Admitting: Physician Assistant

## 2024-07-24 DIAGNOSIS — R42 Dizziness and giddiness: Secondary | ICD-10-CM

## 2024-07-24 DIAGNOSIS — R81 Glycosuria: Secondary | ICD-10-CM | POA: Diagnosis not present

## 2024-07-24 LAB — POCT URINE DIPSTICK
Bilirubin, UA: NEGATIVE
Glucose, UA: 500 mg/dL — AB
Ketones, POC UA: NEGATIVE mg/dL
Nitrite, UA: NEGATIVE
POC PROTEIN,UA: NEGATIVE
Spec Grav, UA: 1.015 (ref 1.010–1.025)
Urobilinogen, UA: 0.2 U/dL
pH, UA: 8.5 — AB (ref 5.0–8.0)

## 2024-07-24 LAB — GLUCOSE, POCT (MANUAL RESULT ENTRY): POCT Glucose (KUC): 126 mg/dL — AB (ref 70–99)

## 2024-07-24 MED ORDER — MECLIZINE HCL 25 MG PO TABS: 25.0000 mg | ORAL_TABLET | Freq: Three times a day (TID) | ORAL | 0 refills | Status: DC | PRN

## 2024-07-24 NOTE — ED Triage Notes (Signed)
 Patient here with family, he reports having Vertigo. This episode started about a week ago but getting worse in the mornings and lasting long with nausea. This morning went on to work, after getting keys to cruiser and headed to parking lot got dizzy and fell (no loc). Noticed some ringing in ears this morning too. No vomiting. No lacerations or abrasions with this recent fall. No previous diagnosis or history of vertigo just thinks it is that. No chest pain. No sob.

## 2024-07-24 NOTE — ED Provider Notes (Signed)
 [facility]    CSN: [id number] Arrival date & time: 07/24/24  0934      History   Chief Complaint Chief Complaint  Patient presents with   Fall   Dizziness    HPI Levi Roberts is a 68 y.o. male.   Patient appears in today with a 2-week history of intermittent dizziness that he describes as disequilibrium and room spinning sensation.  He reports that earlier today the symptoms were severe enough to cause him to have an episode of emesis and he fell down but did not have any loss of consciousness.  He is not experiencing any abrasions or significant pain following the fall and was able to get up himself.  He is having some right elbow pain but this has been ongoing for several days.SABRA  He denies any recent head injury before his symptoms began.  He assumes that he has vertigo but denies a history of similar conditions in the past.  He denies history of stroke but does have several risk factors including CAD, hypertension, hyperlipidemia, diabetes.  He reports that his blood sugars are appropriately managed and this morning was around 110.  He denies any recent medication changes.  He denies any recent illness; he did have a mild cold about 2 weeks ago before his symptoms began but the symptoms have resolved and denies any ongoing congestion, cough, fever.  He denies any visual disturbance, photophobia, focal weakness, dysarthria.  He is having difficulty with his daily activities as a result of the symptoms.  He denies any chest pain, shortness of breath, headache.  He has not tried any over-the-counter medication for symptom management.    Past Medical History:  Diagnosis Date   CAD (coronary artery disease), native coronary artery    coronary calcium  score of 71 with 25 to 49% proximal RCA and mid OM1 on CTA 02/2024   Diabetes mellitus without complication (HCC)    High cholesterol    Hypertension    Sleep disorder, circadian, shift work type 10/15/2017    Patient  Active Problem List   Diagnosis Date Noted   CAD (coronary artery disease), native coronary artery    Abnormal EKG 11/09/2023   Encounter for annual physical exam 11/05/2023   Anemia 06/10/2023   Chronic constipation 06/10/2023   Hearing loss 11/18/2022   Erectile dysfunction due to arterial insufficiency 01/30/2021   Benign prostatic hyperplasia with lower urinary tract symptoms 01/30/2021   Elevated PSA 01/30/2021   Dyslipidemia associated with type 2 diabetes mellitus (HCC) 05/13/2019   Essential hypertension 05/13/2019   Type 2 diabetes mellitus without complication, with long-term current use of insulin  (HCC) 05/13/2019   Obesity, diabetes, and hypertension syndrome (HCC) 12/18/2018   Pure hypercholesterolemia 12/18/2018   Class 2 severe obesity due to excess calories with serious comorbidity and body mass index (BMI) of 39.0 to 39.9 in adult 10/15/2017   Snoring 10/15/2017   Poor sleep hygiene 10/15/2017    No past surgical history on file.     Home Medications    Prior to Admission medications   Medication Sig Start Date End Date Taking? Authorizing Provider  meclizine  (ANTIVERT ) 25 MG tablet Take 1 tablet (25 mg total) by mouth 3 (three) times daily as needed for dizziness. 07/24/24  Yes Dravon Nott, Rocky POUR, PA-C  aspirin  EC 81 MG tablet Take 1 tablet (81 mg total) by mouth daily. Swallow whole. 03/12/24   Shlomo Wilbert SAUNDERS, MD  atorvastatin  (LIPITOR) 20 MG tablet TAKE 1 TABLET  EVERY DAY 06/19/24   Jarold Medici, MD  Empagliflozin -metFORMIN  HCl ER (SYNJARDY  XR) 12.12-998 MG TB24 Take 2 tablets by mouth daily. 07/01/24   Jarold Medici, MD  finasteride  (PROSCAR ) 5 MG tablet Take 1 tablet (5 mg total) by mouth daily. 05/08/24   Stoioff, Glendia BROCKS, MD  hydrochlorothiazide  (MICROZIDE ) 12.5 MG capsule Take 1 capsule (12.5 mg total) by mouth daily. 02/24/24 07/07/24  Shlomo Wilbert SAUNDERS, MD  insulin  degludec (TRESIBA  FLEXTOUCH) 200 UNIT/ML FlexTouch Pen Inject 54 Units into the skin daily.  10/11/23   Jarold Medici, MD  Insulin  Pen Needle (NOVOFINE PEN NEEDLE) 32G X 6 MM MISC Use for daily insulin  injections 10/11/23   Jarold Medici, MD  OZEMPIC , 2 MG/DOSE, 8 MG/3ML SOPN Inject 2 mg into the skin once a week. 10/24/23   [provider]  tadalafil  (CIALIS ) 20 MG tablet 1-2 by mouth 1 hour prior to intercourse 05/08/24   Stoioff, Glendia BROCKS, MD  tamsulosin  (FLOMAX ) 0.4 MG CAPS capsule Take 2 capsules (0.8 mg total) by mouth daily. 05/08/24   Stoioff, Glendia BROCKS, MD  telmisartan  (MICARDIS ) 20 MG tablet Take 1 tablet (20 mg total) by mouth daily. 02/24/24   Shlomo Wilbert SAUNDERS, MD    Family History Family History  Problem Relation Age of Onset   Diabetes Mother    Heart attack Mother    Kidney cancer Father    Cancer Father    Diabetes Sister    Healthy Sister     Social History Social History   Tobacco Use   Smoking status: Never   Smokeless tobacco: Never   Tobacco comments:    Never started  Vaping Use   Vaping status: Never Used  Substance Use Topics   Alcohol use: Never   Drug use: Never     Allergies   Patient has no known allergies.   Review of Systems Review of Systems  Constitutional:  Negative for activity change, appetite change, fatigue and fever.  Eyes:  Negative for visual disturbance.  Respiratory:  Negative for shortness of breath.   Cardiovascular:  Negative for chest pain, palpitations and leg swelling.  Gastrointestinal:  Positive for nausea and vomiting. Negative for diarrhea.  Musculoskeletal:  Positive for arthralgias (Right elbow). Negative for myalgias.  Neurological:  Positive for dizziness. Negative for syncope, speech difficulty, weakness, light-headedness and headaches.     Physical Exam Triage Vital Signs ED Triage Vitals  Encounter Vitals Group     BP --      Girls Systolic BP Percentile --      Girls Diastolic BP Percentile --      Boys Systolic BP Percentile --      Boys Diastolic BP Percentile --      Pulse Rate 07/24/24  1023 73     Resp 07/24/24 1023 20     Temp 07/24/24 1023 (!) 97.5 F (36.4 C)     Temp Source 07/24/24 1023 Oral     SpO2 07/24/24 1023 95 %     Weight 07/24/24 1020 (!) 307 lb (139.3 kg)     Height 07/24/24 1020 6' 3 (1.905 m)     Head Circumference --      Peak Flow --      Pain Score 07/24/24 1020 0     Pain Loc --      Pain Education --      Exclude from Growth Chart --    Orthostatic VS for the past 24 hrs:  BP- Lying Pulse- Lying  BP- Standing at 0 minutes Pulse- Standing at 0 minutes  07/24/24 1024 137/79 71 147/79 96    Updated Vital Signs Pulse 73   Temp (!) 97.5 F (36.4 C) (Oral)   Resp 20   Ht 6' 3 (1.905 m)   Wt (!) 307 lb (139.3 kg)   SpO2 95%   BMI 38.37 kg/m   Visual Acuity Right Eye Distance:   Left Eye Distance:   Bilateral Distance:    Right Eye Near:   Left Eye Near:    Bilateral Near:     Physical Exam Vitals reviewed.  Constitutional:      General: He is awake.     Appearance: Normal appearance. He is well-developed. He is not ill-appearing.     Comments: Very pleasant male appears stated age in no acute distress sitting comfortably in exam room  HENT:     Head: Normocephalic and atraumatic.     Right Ear: Tympanic membrane, ear canal and external ear normal. No hemotympanum.     Left Ear: Tympanic membrane, ear canal and external ear normal. No hemotympanum.     Nose: Nose normal.     Mouth/Throat:     Pharynx: Uvula midline. No oropharyngeal exudate or posterior oropharyngeal erythema.  Eyes:     Extraocular Movements: Extraocular movements intact.     Conjunctiva/sclera: Conjunctivae normal.     Pupils: Pupils are equal, round, and reactive to light.  Cardiovascular:     Rate and Rhythm: Normal rate and regular rhythm.     Heart sounds: Normal heart sounds, S1 normal and S2 normal. No murmur heard. Pulmonary:     Effort: Pulmonary effort is normal. No accessory muscle usage or respiratory distress.     Breath sounds: Normal  breath sounds. No stridor. No wheezing, rhonchi or rales.     Comments: Clear to auscultation bilaterally Musculoskeletal:     Comments: Strength 5/5 bilateral upper and lower extremities  Neurological:     General: No focal deficit present.     Mental Status: He is alert and oriented to person, place, and time.     Cranial Nerves: Cranial nerves 2-12 are intact.     Motor: Motor function is intact.     Coordination: Coordination is intact. Romberg sign negative. Rapid alternating movements normal.     Gait: Gait is intact.     Comments: Cranial nerves II through XII grossly intact.  No focal neurological defect on exam.  Psychiatric:        Behavior: Behavior is cooperative.      UC Treatments / Results  Labs (all labs ordered are listed, but only abnormal results are displayed) Labs Reviewed  GLUCOSE, POCT (MANUAL RESULT ENTRY) - Abnormal; Notable for the following components:      Result Value   POCT Glucose ([facility]) 126 (*)    All other components within normal limits  POCT URINE DIPSTICK - Abnormal; Notable for the following components:   Clarity, UA cloudy (*)    Glucose, UA =500 (*)    Blood, UA trace-intact (*)    pH, UA 8.5 (*)    Leukocytes, UA Small (1+) (*)    All other components within normal limits  URINE CULTURE  CBC WITH DIFFERENTIAL/PLATELET  COMPREHENSIVE METABOLIC PANEL WITH GFR    EKG   Radiology No results found.  Procedures Procedures (including critical care time)  Medications Ordered in UC Medications - No data to display  Initial Impression / Assessment and Plan / UC  Course  I have reviewed the triage vital signs and the nursing notes.  Pertinent labs & imaging results that were available during my care of the patient were reviewed by me and considered in my medical decision making (see chart for details).     Patient is well-appearing, afebrile, nontoxic, nontachycardic.  He had appropriate orthostatic vital signs.  I am concerned that  he has vertigo given his clinical presentation but we discussed that given his worsening symptoms I cannot rule out something more serious and he does have several risk factors for atherosclerotic disease.  Given he has had symptoms for several weeks we discussed that it is reasonable to attempt outpatient management and follow-up with neurology but that if he has any recent worsening of symptoms he should go to the emergency room since we do not have imaging capabilities in urgent care.  EKG was obtained that showed normal sinus rhythm with T wave inversion in leads I and aVL that are consistent with his previous tracing and without ischemic changes; compared to 02/24/2024 tracing he has slightly earlier R wave progression and nonspecific ST changes in lead III but no ischemic changes.  His urine did show glucosuria and trace leukocyte esterase; I suspect the glucosuria is related to SGLT2 inhibitor use and has random glucose was appropriate in clinic; will also send this for culture to ensure that there is no UTI contributing to symptoms but defer antibiotics for the the time being.  Basic blood work including CBC and CMP were obtained and pending.  Will try meclizine  to help manage your symptoms and we discussed that if anything worsens he needs to be seen immediately in the emergency room.  He was given the contact information for neurology with instruction to call to schedule an appointment.  All questions were answered to patient satisfaction.  He expressed understanding and agreement with treatment plan.  Final Clinical Impressions(s) / UC Diagnoses   Final diagnoses:  Dizziness  Glucosuria     Discharge Instructions      As we discussed, I cannot rule out that there is something going on in your brain since we do not have imaging.  I do recommend that you follow-up with neurology; call to schedule appointment.  If anything worsens and you have persistent dizziness, passing out, recurrent vomiting,  weakness, vision change, confusion you need to go to the emergency room immediately.  I will contact you if any of your blood work is abnormal.  I also recommend following up with your primary care soon as possible.     ED Prescriptions     Medication Sig Dispense Auth. Provider   meclizine  (ANTIVERT ) 25 MG tablet Take 1 tablet (25 mg total) by mouth 3 (three) times daily as needed for dizziness. 12 tablet Eyanna Mcgonagle K, PA-C      PDMP not reviewed this encounter.   Sherrell Rocky POUR, PA-C 07/24/24 1258

## 2024-07-24 NOTE — Discharge Instructions (Addendum)
 As we discussed, I cannot rule out that there is something going on in your brain since we do not have imaging.  I do recommend that you follow-up with neurology; call to schedule appointment.  If anything worsens and you have persistent dizziness, passing out, recurrent vomiting, weakness, vision change, confusion you need to go to the emergency room immediately.  I will contact you if any of your blood work is abnormal.  I also recommend following up with your primary care soon as possible.

## 2024-07-24 NOTE — ED Notes (Signed)
 Of note; He does state he has random right elbow pain.

## 2024-07-25 LAB — COMPREHENSIVE METABOLIC PANEL WITH GFR
ALT: 10 IU/L (ref 0–44)
AST: 14 IU/L (ref 0–40)
Albumin: 3.9 g/dL (ref 3.9–4.9)
Alkaline Phosphatase: 84 IU/L (ref 47–123)
BUN/Creatinine Ratio: 15 (ref 10–24)
BUN: 16 mg/dL (ref 8–27)
Bilirubin Total: 0.4 mg/dL (ref 0.0–1.2)
CO2: 25 mmol/L (ref 20–29)
Calcium: 9.2 mg/dL (ref 8.6–10.2)
Chloride: 100 mmol/L (ref 96–106)
Creatinine, Ser: 1.04 mg/dL (ref 0.76–1.27)
Globulin, Total: 3.7 g/dL (ref 1.5–4.5)
Glucose: 110 mg/dL — ABNORMAL HIGH (ref 70–99)
Potassium: 5.3 mmol/L — ABNORMAL HIGH (ref 3.5–5.2)
Sodium: 138 mmol/L (ref 134–144)
Total Protein: 7.6 g/dL (ref 6.0–8.5)
eGFR: 79 mL/min/1.73 (ref >59)

## 2024-07-25 LAB — CBC WITH DIFFERENTIAL/PLATELET
Basophils Absolute: 0 x10E3/uL (ref 0.0–0.2)
Basos: 0 %
EOS (ABSOLUTE): 0.1 x10E3/uL (ref 0.0–0.4)
Eos: 1 %
Hematocrit: 42.4 % (ref 37.5–51.0)
Hemoglobin: 13.1 g/dL (ref 13.0–17.7)
Immature Grans (Abs): 0 x10E3/uL (ref 0.0–0.1)
Immature Granulocytes: 0 %
Lymphocytes Absolute: 1.5 x10E3/uL (ref 0.7–3.1)
Lymphs: 23 %
MCH: 27 pg (ref 26.6–33.0)
MCHC: 30.9 g/dL — ABNORMAL LOW (ref 31.5–35.7)
MCV: 87 fL (ref 79–97)
Monocytes Absolute: 0.4 x10E3/uL (ref 0.1–0.9)
Monocytes: 6 %
Neutrophils Absolute: 4.7 x10E3/uL (ref 1.4–7.0)
Neutrophils: 70 %
Platelets: 287 x10E3/uL (ref 150–450)
RBC: 4.86 x10E6/uL (ref 4.14–5.80)
RDW: 13.9 % (ref 11.6–15.4)
WBC: 6.7 x10E3/uL (ref 3.4–10.8)

## 2024-07-25 LAB — URINE CULTURE: Culture: <10000 — AB

## 2024-07-27 ENCOUNTER — Ambulatory Visit (HOSPITAL_COMMUNITY): Payer: Self-pay

## 2024-07-27 ENCOUNTER — Other Ambulatory Visit: Payer: Self-pay | Admitting: Urology

## 2024-08-04 ENCOUNTER — Other Ambulatory Visit: Payer: Self-pay

## 2024-08-04 MED ORDER — MECLIZINE HCL 25 MG PO TABS: 25.0000 mg | ORAL_TABLET | Freq: Three times a day (TID) | ORAL | 1 refills | Status: AC | PRN

## 2024-11-10 ENCOUNTER — Encounter: Payer: Self-pay | Admitting: Internal Medicine

## 2024-12-30 ENCOUNTER — Ambulatory Visit: Payer: Self-pay

## 2025-05-07 ENCOUNTER — Ambulatory Visit: Admitting: Urology
